# Patient Record
Sex: Male | Born: 1950 | Race: White | Hispanic: No | Marital: Married | Smoking: Former smoker
Health system: Southern US, Community
[De-identification: ages and names within clinical notes are randomized; demographics above are authoritative.]

## PROBLEM LIST (undated history)

## (undated) DIAGNOSIS — J302 Other seasonal allergic rhinitis: Secondary | ICD-10-CM

## (undated) DIAGNOSIS — I8390 Asymptomatic varicose veins of unspecified lower extremity: Secondary | ICD-10-CM

## (undated) DIAGNOSIS — M1990 Unspecified osteoarthritis, unspecified site: Secondary | ICD-10-CM

## (undated) DIAGNOSIS — K219 Gastro-esophageal reflux disease without esophagitis: Secondary | ICD-10-CM

## (undated) HISTORY — PX: APPENDECTOMY: SHX54

## (undated) HISTORY — PX: VARICOSE VEIN SURGERY: SHX832

## (undated) HISTORY — PX: CHOLECYSTECTOMY: SHX55

---

## 2004-10-22 ENCOUNTER — Inpatient Hospital Stay (HOSPITAL_COMMUNITY): Admission: EM | Admit: 2004-10-22 | Discharge: 2004-10-24 | Payer: Self-pay | Admitting: Emergency Medicine

## 2005-01-26 ENCOUNTER — Encounter: Admission: RE | Admit: 2005-01-26 | Discharge: 2005-01-26 | Payer: Self-pay | Admitting: Specialist

## 2008-01-10 ENCOUNTER — Ambulatory Visit (HOSPITAL_COMMUNITY): Admission: RE | Admit: 2008-01-10 | Discharge: 2008-01-10 | Payer: Self-pay | Admitting: Dermatology

## 2008-02-08 ENCOUNTER — Ambulatory Visit: Payer: Self-pay | Admitting: Vascular Surgery

## 2008-05-11 ENCOUNTER — Ambulatory Visit: Payer: Self-pay | Admitting: Vascular Surgery

## 2008-06-20 ENCOUNTER — Ambulatory Visit: Payer: Self-pay | Admitting: Vascular Surgery

## 2008-07-04 ENCOUNTER — Ambulatory Visit: Payer: Self-pay | Admitting: Vascular Surgery

## 2008-07-11 ENCOUNTER — Ambulatory Visit: Payer: Self-pay | Admitting: Vascular Surgery

## 2008-07-18 ENCOUNTER — Ambulatory Visit: Payer: Self-pay | Admitting: Vascular Surgery

## 2008-07-25 ENCOUNTER — Ambulatory Visit: Payer: Self-pay | Admitting: Vascular Surgery

## 2008-09-07 ENCOUNTER — Ambulatory Visit: Payer: Self-pay | Admitting: Vascular Surgery

## 2009-03-20 ENCOUNTER — Ambulatory Visit: Payer: Self-pay | Admitting: Vascular Surgery

## 2009-12-06 ENCOUNTER — Encounter: Admission: RE | Admit: 2009-12-06 | Discharge: 2009-12-06 | Payer: Self-pay | Admitting: Family Medicine

## 2010-11-19 ENCOUNTER — Emergency Department (INDEPENDENT_AMBULATORY_CARE_PROVIDER_SITE_OTHER): Payer: BC Managed Care – PPO

## 2010-11-19 ENCOUNTER — Emergency Department (HOSPITAL_BASED_OUTPATIENT_CLINIC_OR_DEPARTMENT_OTHER)
Admission: EM | Admit: 2010-11-19 | Discharge: 2010-11-19 | Disposition: A | Discharge status: Home / Self Care | Payer: BC Managed Care – PPO | Attending: Emergency Medicine | Admitting: Emergency Medicine

## 2010-11-19 DIAGNOSIS — M7730 Calcaneal spur, unspecified foot: Secondary | ICD-10-CM

## 2010-11-19 DIAGNOSIS — M722 Plantar fascial fibromatosis: Secondary | ICD-10-CM | POA: Insufficient documentation

## 2010-11-19 DIAGNOSIS — M79609 Pain in unspecified limb: Secondary | ICD-10-CM

## 2010-11-27 ENCOUNTER — Encounter: Payer: Self-pay | Admitting: Family Medicine

## 2010-11-27 ENCOUNTER — Ambulatory Visit (INDEPENDENT_AMBULATORY_CARE_PROVIDER_SITE_OTHER): Payer: BC Managed Care – PPO | Admitting: Family Medicine

## 2010-11-27 DIAGNOSIS — M79609 Pain in unspecified limb: Secondary | ICD-10-CM

## 2010-11-27 DIAGNOSIS — M722 Plantar fascial fibromatosis: Secondary | ICD-10-CM | POA: Insufficient documentation

## 2010-11-27 DIAGNOSIS — M216X9 Other acquired deformities of unspecified foot: Secondary | ICD-10-CM | POA: Insufficient documentation

## 2010-12-02 NOTE — Assessment & Plan Note (Signed)
Summary: LEFT PLANTAR FASCIITIS/NP/LP  # [phone]   Vital Signs:  Patient profile:   60 year old male Height:      77 inches (195.58 cm) Weight:      244.0 pounds (110.91 kg) BMI:     29.04 Temp:     97.5 degrees F (36.39 degrees C) oral Pulse rate:   67 / minute BP sitting:   130 / 80  (right arm)  Vitals Entered By: Baxter Hire) (November 27, 2010 11:24 AM) CC: Left plantar fascitis Pain Assessment Patient in pain? yes     Location: left foot Intensity: 2 Nutritional Status BMI of 25 - 29 = overweight  Does patient need assistance? Functional Status Self care Ambulation Normal   CC:  Left plantar fascitis.  History of Present Illness: 60 yo M here with L > R plantar foot pain  Patient reports working on concrete floors past several months Started developing left > right plantar foot pain around december. Has worsened since that time No known injury No swelling or bruising. Had x-rays in emergency department read as normal and diagnosed with plantar fasciitis. Bought some 2/3 dr [employer] cushions for his shoes. Wearing shoes with higher arch support has helped. Has not tried any other treatment (exercises, medicines, bracing).  Habits & Providers  Alcohol-Tobacco-Diet     Alcohol drinks/day: 1-2 glasses     Alcohol type: wine     Tobacco Status: quit > 6 months  Problems Prior to Update: None  Medications Prior to Update: 1)  None  Allergies (verified): 1)  ! * Vitamin C 2)  ! 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 TTP throughout plantar  fascia L > R feet.  No TTP achilles or elsewhere about feet/ankles. FROM ankles and toes. negative ant drawers and talar tilts. 2+ dp pulses.   Impression & Recommendations:  Problem # 1:  FOOT PAIN, BILATERAL (ICD-729.5) Assessment New  Actually has symptoms bilateral feet though left is worse - c/w plantar fasciitis.  Arch straps, stretches and step exercise/eccentrics demonstrated.  Icing, tylenol/aleve as needed.  Comforthotics in combination with arch support already in his shoes - felt comfortable with these - expect these to help as well.  Given a couple pairs of heel lifts to try if still not improving over the next 2-3 weeks.  F/u in 6 weeks to reassess.  See instructions for further.  Orders: Sports Insoles [phone]) Foot Orthosis ( Arch Strap/Heel Cup) [phone])  Problem # 2:  PLANTAR FASCIITIS, BILATERAL (ICD-728.71)  See #1 above  Orders: Sports Insoles (L3510) Foot Orthosis ( Arch Strap/Heel Cup) [phone])  Problem # 3:  CAVUS DEFORMITY OF FOOT, ACQUIRED (ICD-736.73) Assessment: New  Orders: Sports Insoles [phone]) Foot Orthosis ( Arch Strap/Heel Cup) [phone])  Patient Instructions: 1)  You have plantar fasciitis. 2)  Take tylenol or aleve as needed for pain  3)  Plantar fascia stretch for 20-30 seconds in morning and do this 3 times 4)  Lowering/raise on a step exercises 3 x 15 once or twice a day - this is very important for long term improvement. 5)  Can add heel  walks, toe walks forward and backward as well 6)  Ice bucket 10-15 minutes at end of day 7)  Avoid flat shoes/barefoot walking as much as possible. 8)  Arch straps under your socks when up and walking around. 9)  Heel lifts help to unload the area as well. 10)  Custom orthotics, physical therapy, steroid injection are all options if not improving. 11)  Follow up with me in 6 weeks for a recheck.   Orders Added: 1)  New Patient Level III [99203] 2)  Sports Insoles [L3510] 3)  Foot Orthosis ( Arch  Strap/Heel Cup) [J4782]

## 2011-01-08 ENCOUNTER — Ambulatory Visit (INDEPENDENT_AMBULATORY_CARE_PROVIDER_SITE_OTHER): Payer: BC Managed Care – PPO | Admitting: Family Medicine

## 2011-01-08 ENCOUNTER — Encounter: Payer: Self-pay | Admitting: Family Medicine

## 2011-01-08 VITALS — BP 121/78 | HR 67 | Temp 97.4°F | Ht 77.0 in | Wt 233.4 lb

## 2011-01-08 DIAGNOSIS — M79609 Pain in unspecified limb: Secondary | ICD-10-CM

## 2011-01-08 NOTE — Assessment & Plan Note (Signed)
continue with exercises, arch straps, comforthotics, icing.  Has website info to buy more inserts and has done so.  Advised if he does not continue to improve and would consider formal PT, injection, or custom orthotics to return for follow-up.  Otherwise f/u prn.

## 2011-01-08 NOTE — Patient Instructions (Signed)
Keep doing the stretches and exercises (on a step) every day. Continue using the inserts and the arch straps too - all of these things are important for this getting better. Continue icing. Aleve as needed. If you do not continue to improve and want to move forward with physical therapy, a cortisone injection, or custom orthotics, come back for a visit and we'll go ahead with one or some of these. Otherwise follow up with me as needed.

## 2011-01-08 NOTE — Progress Notes (Signed)
  Subjective:    Patient ID: Bruce Suarez, male    DOB: July 13, 1951, 60 y.o.   MRN: [id number]  HPI 60 yo M here for 6 week f/u bilateral plantar fasciitis  Initial visit: Patient reported working on concrete floors past several months  Started developing left > right plantar foot pain around december.  Worsened since that time  No known injury  No swelling or bruising.  Had x-rays in emergency department read as normal and diagnosed with plantar fasciitis.  Bought some 2/3 dr [employer] cushions for his shoes.  Wearing shoes with higher arch support has helped.  Had not tried any other treatment (exercises, medicines, bracing).   60/5: Has been doing regular exercises and stretches, using arch straps, ice baths, comforthotics. He is improving but still has some bad days at times. Not requiring aleve. Pleased with his progress.  History reviewed. No pertinent past medical history.  No current outpatient prescriptions on file prior to visit.    No past surgical history on file.  Allergies  Allergen Reactions  . Peanut-Containing Drug Products   . Vitamin C     History   Social History  . Marital Status: Married    Spouse Name: N/A    Number of Children: N/A  . Years of Education: N/A   Occupational History  . Not on file.   Social History Main Topics  . Smoking status: Never Smoker   . Smokeless tobacco: Not on file  . Alcohol Use: Not on file  . Drug Use: Not on file  . Sexually Active: Not on file   Other Topics Concern  . Not on file   Social History Narrative  . No narrative on file    Family History  Problem Relation Age of Onset  . Heart attack Neg Hx   . Diabetes Neg Hx   . Hypertension Neg Hx     BP 121/78  Pulse 67  Temp(Src) 97.4 F (36.3 C) (Oral)  Ht 6\' 5"  (1.956 m)  Wt 233 lb 6.4 oz (105.87 kg)  BMI 27.68 kg/m2  Review of Systems See HPI above    Objective:   Physical Exam Physical Exam  General:  Well-developed,well-nourished,in no acute distress; alert,appropriate and cooperative throughout examination   Msk: Bilateral feet:  Cavus arches R > L  No other gross deformity, swelling, or bruising.  Mild TTP throughout plantar fascia L > R feet. No TTP achilles or elsewhere about feet/ankles.  FROM ankles and toes.  2+ dp pulses.      Assessment & Plan:  60 yo M with  1. Bilateral foot pain 2/2 bilateral plantar fasciitis - continue with exercises, arch straps, comforthotics, icing.  Has website info to buy more inserts and has done so.  Advised if he does not continue to improve and would consider formal PT, injection, or custom orthotics to return for follow-up.  Otherwise f/u prn.

## 2011-02-17 NOTE — Procedures (Signed)
LOWER EXTREMITY VENOUS REFLUX EXAM   INDICATION:  Followup bilateral lower extremity varicose vein with pain.   EXAM:  Using color-flow imaging and pulse Doppler spectral analysis, the  right and left common femoral, superficial femoral, popliteal, posterior  tibial, greater and lesser saphenous veins are evaluated.  There is no  evidence suggesting deep venous insufficiency in the right or left lower  extremities.   The right saphenofemoral junction is mildly incompetent, the left is  competent.  The right and left greater saphenous veins are not competent  with the caliber as described below.   The right and left proximal short saphenous veins demonstrate  competency.   GSV Diameter (used if found to be incompetent only)                                            Right    Left  Proximal Greater Saphenous Vein           0.49 cm  0.81 cm  Proximal-to-mid-thigh                     cm       cm  Mid thigh                                 0.43 cm  0.51 cm  Mid-distal thigh                          cm       cm  Distal thigh                              0.45 cm  0.71 cm  Knee                                      0.41 cm  0.57 cm    IMPRESSION:  1. The right greater saphenous vein reflux is identified with the      caliber ranging from 0.41 cm to 0.61 cm knee to groin.  The left      from 0.49 cm to 0.81 cm.  2. The right and left greater saphenous veins are not aneurysmal.  3. The right and left greater saphenous veins are not tortuous.  4. The deep venous system is competent.  5. The right and left lesser saphenous veins are competent.       ___________________________________________  Larina Earthly, M.D.   AS/MEDQ  D:  05/11/2008  T:  05/11/2008  Job:  [id number]

## 2011-02-17 NOTE — Consult Note (Signed)
NEW PATIENT CONSULTATION   Bruce Suarez, Bruce Suarez  DOB:  [birth year]                                       02/08/2008  CHART#:[id number]   The patient is referred today by Dr. Vikki Ports for evaluation  of bilateral lower extremity venous insufficiency.  The patient is a  healthy 60 year old white male with a longstanding history of leg  swelling, leg itching and venous varicosities.  He struck his pretibial  area on the right in January with a fall and has still had slow healing  with ulcerations.  He reports several components to the discomfort in  his legs.  He does have pain, severe itching in his calves and on to his  feet bilaterally for greater than 10 years.  He does have marked  swelling in his lower extremities as well.  He does have a slow healing  ulceration of his right pretibial area.  He does have bulging  varicosities, more so on his left than on his right leg.   PAST MEDICAL HISTORY:  Otherwise unremarkable.  He has no major medical  difficulties.  He has been placed on several courses of antibiotics for  the pretibial wound.  He has a strong family history of varicose veins  in his mother and his father.   SOCIAL HISTORY:  He is married.  He has no children.  He is a [employer] officer.  He does not smoke, having quit in 1990.  He does have 2-3  glasses of wine per week.   REVIEW OF SYSTEMS:  A 12 point review of systems is totally negative  other than weight loss.   MEDICATION ALLERGIES:  Vitamin C.  He does also have allergy to peanuts.  He does have allergy to Septra causing a rash.   CURRENT MEDICATIONS:  Amoxicillin for the pretibial area.   PHYSICAL EXAMINATION:  A well-developed, well-nourished white male  appearing his stated age of 14.  Blood pressure 143/84, pulse 74,  respirations 18.  His lower extremities reveal marked swelling in both  legs with a great deal of indentation from his sock and it is 9 a.m. on  this exam.  He  does have skin changes bilaterally, more so on the right  than on the left.  He does have 2+ dorsalis pedis pulses bilaterally.  He has marked saphenous veins in his left calf and some smaller  reticular veins over his right thigh.   He underwent screening duplex by me showing reflux in the saphenous vein  with an enlarged saphenous vein bilaterally.  I discussed this in detail  with the patient.  He has not worn compression garments.  I explained  that this may relieve some of his symptoms.  We have fitted him with  thigh high 20-30 mm compression garments today.  He is encouraged to  continue to elevate his legs if possible and he does take Aleve for the  leg pain.  He also reports some night cramping in his left leg and I  explained that this in all likelihood is not related to his venous  pathology.  We will see him again in 3 months with formal duplex at that  time to determine if he is having successful treatment with conservative  therapy.  I did discuss the option of laser ablation and stab  phlebectomy of  his varicosities for symptom relief.  We will discuss  this further at his next visit.   Larina Earthly, M.D.  Electronically Signed   TFE/MEDQ  D:  02/08/2008  T:  02/09/2008  Job:  [id number]   cc:   Vikki Ports, M.D.

## 2011-02-17 NOTE — Assessment & Plan Note (Signed)
OFFICE VISIT   [name], [name]  DOB:  January 16, 1951                                       07/25/2008  CHART#:[id number]   The patient presents today for a 1-week followup of his left leg great  saphenous vein laser ablation and multiple stab phlebectomies in his  calf.  He had had a prior right leg ablation on 07/04/2008.  He has done  quite well with his treatment as well.  He has the usual erythema to his  thigh at the ablation site and has minimal discomfort at his phlebectomy  sites.  He underwent venous duplex today, and this reveals closure at  his left greater saphenous vein from the proximal calf through the  saphenofemoral junction.  He does not have any evidence of deep vein  thrombosis.  I am pleased with his initial result, as is the patient.  He will continue his usual activities and we will see him again in 6  weeks for final followup.   Larina Earthly, M.D.  Electronically Signed   TFE/MEDQ  D:  07/25/2008  T:  07/26/2008  Job:  [id number]

## 2011-02-17 NOTE — Assessment & Plan Note (Signed)
OFFICE VISIT   Bruce Suarez, Bruce Suarez  DOB:  18-Sep-1951                                       05/11/2008  CHART#:[id number]   The patient presents today for continued follow-up of his lower  extremity venous hypertension.  He has been wearing graduated  compression stockings since our initial visit with him in May 2009.  He  reports that this has not improved his pain and swelling and he does  continue to have left pain and swelling with prolonged sitting.  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  He also reports severe itching and skin breakdown  over his calves bilaterally at the area of hyperpigmentation.  He has  had to stop running secondary to leg pain and swelling, which he prior  did as far as exercise, and reports difficulty sleeping secondary to leg  pain and swelling.  He has worn graduated compression garments 20-30  mmHg for 3 months with no relief.  He does elevate his legs as much as  possible and continues to take ibuprofen.  I discussed the options with  the patient, he clearly has failed conservative therapy.  He underwent  formal duplex today in our office and this confirmed what we had seen  with the screening duplex on his initial visit.  He has had gross reflux  throughout his great saphenous vein bilaterally with reflux into the  varices.  I have recommended staged ablation of his great saphenous vein  bilaterally.  He also has bulging varicosities in the left calf and we  recommended stab phlebectomy in the same setting as his left leg laser  ablation.  He understands and wishes to proceed as soon we can get him  on the schedule.   Larina Earthly, M.D.  Electronically Signed   TFE/MEDQ  D:  05/11/2008  T:  05/14/2008  Job:  [id number]

## 2011-02-17 NOTE — Assessment & Plan Note (Signed)
OFFICE VISIT   Bruce Suarez, Bruce Suarez  DOB:  31-Aug-1951                                       03/20/2009  CHART#:[id number]   The patient presents today for continued discussion regarding lower leg  swelling.  He is well known to me from a prior staged bilateral greater  saphenous vein laser ablation in the Fall of 2009.  He had swelling and  aching preprocedurally.  He had resolution of the swelling and  discomfort.  Over the past month he has noted recurrent swelling  bilaterally.  He has some pitting edema from his knees distally.  He  does not have any pain.  He also has several reticular veins over his  right thigh.  He has not been wearing his compression garments, which we  had not recommended following his procedure.  He underwent repeat venous  duplex in our office to rule out any recurrent venous pathology.  This  shows no evidence of bilateral DVT, mild reflux in the right common  femoral vein.  The small saphenous veins are competent bilaterally and  both great saphenous veins were successfully ablated with no recurrent  recanalization.  I discussed this at length with the patient.  I  explained that I do not see any evidence of venous pathology to explain  his leg swelling.  He will see Dr. Para March, with [facility],  in 2 weeks for a scheduled visit and I asked him to discuss the  possibility of diuretic treatment at that time.  He will see [place] again on  an as-needed basis.   Larina Earthly, M.D.  Electronically Signed   TFE/MEDQ  D:  03/20/2009  T:  03/21/2009  Job:  [id number]   cc:   Dwana Curd. Para March, M.D.

## 2011-02-17 NOTE — Assessment & Plan Note (Signed)
OFFICE VISIT   Bruce Suarez, Bruce Suarez  DOB:  1951/05/04                                       07/11/2008  CHART#:[id number]   Patient presents today with one-week followup of laser ablation of his  right greater saphenous vein.  He has the usual amount of erythema at  the ablation site and otherwise is recovering to his normal health.  He  has very mild bruising and mild-to-moderate inflammation.   He underwent repeat venous duplex today, and this revealed complete  closure of his saphenous vein from the mid calf insertion site up to the  saphenofemoral junction.  The vein was without injury.   I am quite pleased with his initial result, as is patient.  I plan to  see him again in one week for staged ablation of his left greater  saphenous vein.   Larina Earthly, M.D.  Electronically Signed   TFE/MEDQ  D:  07/11/2008  T:  07/12/2008  Job:  [id number]

## 2011-02-17 NOTE — Procedures (Signed)
DUPLEX DEEP VENOUS EXAM - LOWER EXTREMITY   INDICATION:  Bilateral lower extremity edema.   HISTORY:  Edema:  Yes.  Trauma/Surgery:  Bilateral greater saphenous vein ablation.  Pain:  No.  PE:  No.  Previous DVT:  No.  Anticoagulants:  No.  Other:   DUPLEX EXAM:                CFV   SFV   PopV  PTV    GSV                R  L  R  L  R  L  R   L  R  L  Thrombosis    o  o  o  o  o  o  o   o  Spontaneous   +  +  +  +  +  +  +   +  Phasic        +  +  +  +  +  +  +   +  Augmentation  +  +  +  +  +  +  +   +  Compressible  +  +  +  +  +  +  +   +  Competent     +  +  +  +  +  +  +   +   Legend:  + - yes  o - no  p - partial  D - decreased   IMPRESSION:  1. No evidence of deep venous thrombosis in bilateral lower      extremities.  2. Mild reflux noted in right common femoral vein.  3. The right and left short saphenous vein demonstrates competency.  4. Bilateral greater saphenous veins appear ablated.      _____________________________  Larina Earthly, M.D.   AC/MEDQ  D:  03/20/2009  T:  03/20/2009  Job:  [id number]

## 2011-02-17 NOTE — Assessment & Plan Note (Signed)
OFFICE VISIT   Bruce Suarez, Bruce Suarez  DOB:  [birth year]                                       09/07/2008  CHART#:[id number]   The patient presents today for continued follow-up of a staged laser  ablation of bilateral great saphenous vein in September and October of  this year.  He is quite pleased with his result.  He reports a marked  drop in his swelling and discomfort and itching around his ankles.  He  does have improvement in the skin changes that he had from his  preoperative venous hypertension.  His stab phlebectomy sites have all  healed completely.  He is quite pleased with his result, as am I.  He  knows to notify us should he develop any new difficulties.  Otherwise,  we will see him again on an as-needed basis.   Larina Earthly, M.D.  Electronically Signed   TFE/MEDQ  D:  09/07/2008  T:  09/10/2008  Job:  [id number]

## 2011-02-17 NOTE — Procedures (Signed)
DUPLEX DEEP VENOUS EXAM - LOWER EXTREMITY   INDICATION:  Follow-up evaluation, post greater saphenous vein ablation.   HISTORY:  Edema:  No.  Trauma/Surgery:  Two weeks post left greater saphenous vein ablation.  Previous right greater saphenous vein ablation.  Pain:  Left leg.  PE:  No.  Previous DVT:  No.  Anticoagulants:  No.  Other:   DUPLEX EXAM:                CFV   SFV   PopV  PTV    GSV                R  L  R  L  R  L  R   L  R  L  Thrombosis    o  o     o     o      o     +  Spontaneous   +  +     +     +      +     0  Phasic        +  +     +     +      +     0  Augmentation  +  +     +     +      +     0  Compressible  +  +     +     +      +     0  Competent     +  +     +     +      +     0   Legend:  + - yes  o - no  p - partial  D - decreased   IMPRESSION:  1. The left greater saphenous vein is thrombosed from the      saphenofemoral junction into the proximal calf.  The remaining      portion of the greater saphenous vein in the calf is competent.  2. No evidence of thrombus in the common femoral vein or circumflex      vein.  3. No evidence of deep venous thrombus or baker's cyst.    _____________________________  Larina Earthly, M.D.   MC/MEDQ  D:  07/25/2008  T:  07/25/2008  Job:  [id number]

## 2011-02-17 NOTE — Procedures (Signed)
DUPLEX DEEP VENOUS EXAM - LOWER EXTREMITY   INDICATION:  One week followup, laser ablation.   HISTORY:  Edema:  No.  Trauma/Surgery:  Right greater saphenous vein laser ablation on  07/04/08.  Pain:  Right mid thigh tenderness.  PE:  No.  Previous DVT:  No.  Anticoagulants:  Other:   DUPLEX EXAM:                CFV   SFV   PopV  PTV    GSV                R  L  R  L  R  L  R   L  R  L  Thrombosis    o  o  o     o     o      +  Spontaneous   +  +  +     +     +      0  Phasic        +  +  +     +     +      0  Augmentation  +  +  +     +     +      0  Compressible  +  +  +     +     +      0  Competent     0           +            +   Legend:  + - yes  o - no  p - partial  D - decreased   IMPRESSION:  1. No evidence of deep venous thrombosis noted in the right lower      extremity.  2. Totally occluded right greater saphenous vein extending from the      distal insertion site to the groin area with no reflux noted at the      saphenofemoral junction level.  3. Reflux is noted in the right common femoral vein.   _____________________________  Larina Earthly, M.D.   CH/MEDQ  D:  07/11/2008  T:  07/11/2008  Job:  [id number]

## 2011-02-20 NOTE — Op Note (Signed)
NAME:  Bruce Suarez NO.:  [id number]   MEDICAL RECORD NO.:  [id number]          PATIENT TYPE:  INP   LOCATION:  0445                         FACILITY:  [facility]   PHYSICIAN:  [name], M.D.DATE OF BIRTH:  May 18, 1951   DATE OF PROCEDURE:  10/23/2004  DATE OF DISCHARGE:                                 OPERATIVE REPORT   PREOPERATIVE DIAGNOSES:  1.  Acute appendicitis.  2.  Gallstones.   POSTOPERATIVE DIAGNOSES:  1.  Acute appendicitis.  2.  Chronic cholecystectomy with stones.   OPERATIVE PROCEDURE:  Laparoscopic cholecystectomy with cholangiogram and  laparoscopic appendectomy.   SURGEON:  Anselm Pancoast. Zachery Dakins, M.D.   ASSISTANT:  Angelia Mould. Derrell Lolling, M.D.   ANESTHESIA:  General anesthesia.   HISTORY:  Bruce Suarez is a 60 year old male from [place] who works with  Verne Spurr who went to [facility] yesterday with about a two-day history of  kind of vague, right-sided abdominal pain and had a normal white count and  was not acutely tender but was sent to [facility] for a CT that  was read as a moderately enlarged appendix, possible early acute  appendicitis.  He was referred here, and I saw him in the emergency room  about 6 p.m. and on physical exam, his tenderness was definitely to the  right of the umbilicus, not rigid or real acute.  Review of the CT that he  had brought with him showed that he definitely had gallstones and the  appendix was about 9 mm in size, a little bit larger than usual, but I was  not impressed, and he had a lot of acute periappendiceal inflammation, etc.  I re-examined him about two hours later as I was going to the OR when I  first saw him, and his pain was not getting worse and I elected to put him  n.p.o., start him on Unasyn so I could review the CT with our radiologist  since there was no one here after the 7 o'clock time.  I had called and  talked with the radiologist at [facility] who said, yes, he  was not sure  that they had noted the stones on the CAT scan, but he definitely did have  gallstones and they thought he had an early acute appendicitis.  This  morning he is continuing to be basically no change in his abdominal  tenderness, although he is kind of vague with deep palpation.  He did have a  temperature of about 99.  White count was 7,600.  I recommended that we  proceed on with a laparoscopic cholecystectomy with cholangiogram and also  probably appendectomy, hoping we could do them through the same port sites.   DESCRIPTION OF PROCEDURE:  The patient was given 3 g of Unasyn  preoperatively.  I did not place a Foley as he voided right before going to  the OR.  Induction of general anesthesia with endotracheal tube, also had PE  stockings and then the abdomen was prepped with Betadine surgical scrub  solution and draped in sterile fashion.  A small incision was made below the  umbilicus, the fascia identified, carefully opened into the peritoneal  cavity with a Tresa Endo and then a pursestring suture of 0 Vicryl placed.  Hasson cannula was introduced and then the camera was inserted.  The patient  did have a kind of a tense gallbladder, but it was not acutely inflamed.  There were some chronic adhesions around it, and these were carefully taken  down.  The upper 10 mm trocar had been placed under direct vision after  anesthetizing the fascia and the two lateral 5 mm ports were placed quite  far lateral as I hoped they could be used for the appendectomy also.  Then,  we switched to the 30-degree scope so that the more proximal portion of the  gallbladder could be visualized because of his obesity and fat distribution.  I then dissected down and identified the cystic duct, also the cystic artery  and a clip was placed along the cystic duct with the junction of the  gallbladder and doubly clipped proximal to the cystic artery, singly  distally.  We then made a small opening and a  _____ catheter was introduced  and held in place with a clip.  X-ray was obtained which showed good prompt  fill of the extrahepatic biliary system, good flow into the duodenum and a  little short cystic duct.  The left and right bile ducts were also  visualized.  Catheter was removed.  The cystic duct was triply clipped and  then divided.  I replaced one of the clips on the cystic artery proximally  and then divided it and then the gallbladder was freed from its bed with  _____.  There was a little bit of bile spillage that was irrigated and  aspirated, I placed the gallbladder in an EndoCatch bag and then brought it  out at the infraumbilicus.  With the camera in the upper 10 mm port and  reinserting the Hasson cannula, we then placed him in a steep Trendelenburg  position rotated to the left and then dissecting down with the small bowel  drifting over to the left, we could see just a portion of the cecum, but you  could see an inflamed appendix which was grasped first with a grasper and  then switched to the million dollar grasper through the right 5 mm port.  We  then switched the camera to the umbilicus so we could dissect out the  mesentery and the mesenteric vessels, there appeared to be three, that were  doubly clipped with the clipper and then these divided and then the  appendix, cecal space was identified and I went across it with the vascular  GIA.  I had switched the camera back up to the subxiphoid port site for this  so that it would reach through the umbilical port.  The appendix was freed.  There was no evidence of any bleeding.  Good closure of the cecal side, and  then the inflamed appendix was placed in the EndoCatch bag and brought out  through the umbilical fascial defect.  I reinserted the port, irrigated both  the subhepatic area where the gallbladder had been removed, no bile, no  bleeding, also irrigation around the cecum, and there was no bleeding. Irrigating  fluid was removed.  The carbon dioxide was released.  The ports  were withdrawn under direct vision.  I now placed another figure-of-eight of  0 Vicryl in the fascia and umbilicus, anesthetized this fascia and then  removed the subxiphoid port under  direct vision.  The patient's subcutaneous  wounds were closed with 4-0 Vicryl, Benzoin and Steri-Strips in the skin.  The patient tolerated the procedure nicely, and I am going to plan on giving  him one additional dose of Unasyn this evening and hopefully he will be  ready for release tomorrow.      WJW/MEDQ  D:  10/23/2004  T:  10/23/2004  Job:  [id number]

## 2011-07-24 ENCOUNTER — Encounter (HOSPITAL_BASED_OUTPATIENT_CLINIC_OR_DEPARTMENT_OTHER): Payer: Self-pay

## 2011-07-24 ENCOUNTER — Emergency Department (HOSPITAL_BASED_OUTPATIENT_CLINIC_OR_DEPARTMENT_OTHER)
Admission: EM | Admit: 2011-07-24 | Discharge: 2011-07-24 | Disposition: A | Discharge status: Home / Self Care | Payer: BC Managed Care – PPO | Attending: Emergency Medicine | Admitting: Emergency Medicine

## 2011-07-24 DIAGNOSIS — T360X5A Adverse effect of penicillins, initial encounter: Secondary | ICD-10-CM | POA: Insufficient documentation

## 2011-07-24 DIAGNOSIS — T7840XA Allergy, unspecified, initial encounter: Secondary | ICD-10-CM

## 2011-07-24 DIAGNOSIS — Z79899 Other long term (current) drug therapy: Secondary | ICD-10-CM | POA: Insufficient documentation

## 2011-07-24 DIAGNOSIS — R21 Rash and other nonspecific skin eruption: Secondary | ICD-10-CM | POA: Insufficient documentation

## 2011-07-24 MED ORDER — DEXAMETHASONE SODIUM PHOSPHATE 10 MG/ML IJ SOLN
10.0000 mg | Freq: Once | INTRAMUSCULAR | Status: AC
  Administered 2011-07-24: 10 mg via INTRAMUSCULAR
  Filled 2011-07-24: qty 1

## 2011-07-24 MED ORDER — PREDNISONE 10 MG PO TABS: 20.0000 mg | ORAL_TABLET | Freq: Every day | ORAL | Status: AC

## 2011-07-24 NOTE — ED Notes (Signed)
Scattered hives started today-no resp distress noted-new meds since 10/10 (amoxil and tessalon)

## 2011-07-24 NOTE — ED Provider Notes (Signed)
History     CSN: [id number] Arrival date & time: 07/24/2011  9:10 PM   First MD Initiated Contact with Patient 07/24/11 2249      Chief Complaint  Patient presents with  . Rash    (Consider location/radiation/quality/duration/timing/severity/associated sxs/prior treatment) HPI Comments: PT with one day hx of progressive itchy rash.  Recently started amoxicillin.  Has had similar reaction to sulfa drug in past.  No SOB, no swelling of lips/tongue/face  Patient is a 60 y.o. male presenting with rash. The history is provided by the patient.  Rash  This is a new problem. The current episode started 6 to 12 hours ago. The problem has been gradually worsening. The problem is associated with new medications (amoxicillin/tesselon perles). There has been no fever. The rash is present on the torso (extremities). The patient is experiencing no pain. Associated symptoms include itching. Pertinent negatives include no blisters, no pain and no weeping. He has tried nothing for the symptoms.    History reviewed. No pertinent past medical history.  Past Surgical History  Procedure Date  . Cholecystectomy   . Appendectomy     Family History  Problem Relation Age of Onset  . Heart attack Neg Hx   . Diabetes Neg Hx   . Hypertension Neg Hx     History  Substance Use Topics  . Smoking status: Never Smoker   . Smokeless tobacco: Not on file  . Alcohol Use: Yes      Review of Systems  Constitutional: Negative for fever, chills, diaphoresis and fatigue.  HENT: Negative for congestion, rhinorrhea and sneezing.   Eyes: Negative.   Respiratory: Negative for cough, chest tightness and shortness of breath.   Cardiovascular: Negative for chest pain and leg swelling.  Gastrointestinal: Negative for nausea, vomiting, abdominal pain, diarrhea and blood in stool.  Genitourinary: Negative for frequency, hematuria, flank pain and difficulty urinating.  Musculoskeletal: Negative for back pain and  arthralgias.  Skin: Positive for itching and rash.  Neurological: Negative for dizziness, speech difficulty, weakness, numbness and headaches.    Allergies  Peanut-containing drug products; Sulfa drugs cross reactors; and Vitamin c  Home Medications   Current Outpatient Rx  Name Route Sig Dispense Refill  . AMOXICILLIN 875 MG PO TABS Oral Take 875 mg by mouth 2 (two) times daily.     Marland Kitchen BENZONATATE 200 MG PO CAPS Oral Take 200 mg by mouth 3 (three) times daily as needed. For cough    . CETIRIZINE HCL 10 MG PO TABS Oral Take 10 mg by mouth daily.      Marland Kitchen GLUCOSAMINE-CHONDROITIN 500-400 MG PO TABS Oral Take 1 tablet by mouth daily.      Marland Kitchen ONE-DAILY MULTI VITAMINS PO TABS Oral Take 1 tablet by mouth daily.      Marland Kitchen OMEPRAZOLE MAGNESIUM 20 MG PO TBEC Oral Take 20 mg by mouth daily.      Marland Kitchen PREDNISONE 10 MG PO TABS Oral Take 2 tablets (20 mg total) by mouth daily. 10 tablet 0    BP 155/84  Pulse 85  Temp(Src) 98.2 F (36.8 C) (Oral)  Resp 18  Ht 6\' 5"  (1.956 m)  Wt 230 lb (104.327 kg)  BMI 27.27 kg/m2  SpO2 98%  Physical Exam  Constitutional: He is oriented to person, place, and time. He appears well-developed and well-nourished.  HENT:  Head: Normocephalic and atraumatic.  Mouth/Throat: Oropharynx is clear and moist.  Eyes: Pupils are equal, round, and reactive to light.  Neck: Normal range  of motion. Neck supple. No tracheal deviation present.  Cardiovascular: Normal rate, regular rhythm and normal heart sounds.   Pulmonary/Chest: Effort normal and breath sounds normal. No stridor. No respiratory distress. He has no wheezes. He has no rales. He exhibits no tenderness.  Abdominal: Soft. Bowel sounds are normal. There is no tenderness. There is no rebound and no guarding.  Musculoskeletal: Normal range of motion. He exhibits no edema.  Lymphadenopathy:    He has no cervical adenopathy.  Neurological: He is alert and oriented to person, place, and time.  Skin: Skin is warm and dry.  Rash noted. No petechiae and no purpura noted. Rash is urticarial. Rash is not pustular and not vesicular.       Blanching/uritcarial type rash to trunk/extremities  Psychiatric: He has a normal mood and affect.    ED Course  Procedures (including critical care time)  Labs Reviewed - No data to display No results found.   1. Allergic reaction       MDM  Likely medication induced urticarial rash.  Will start steroids/atarax, f/u with PMD.  Advised to stop meds.        Rolan Bucco, MD 07/24/11 [phone]

## 2014-01-19 ENCOUNTER — Emergency Department (HOSPITAL_BASED_OUTPATIENT_CLINIC_OR_DEPARTMENT_OTHER)
Admission: EM | Admit: 2014-01-19 | Discharge: 2014-01-19 | Disposition: A | Discharge status: Home / Self Care | Payer: BC Managed Care – PPO | Attending: Emergency Medicine | Admitting: Emergency Medicine

## 2014-01-19 ENCOUNTER — Emergency Department (HOSPITAL_BASED_OUTPATIENT_CLINIC_OR_DEPARTMENT_OTHER): Payer: BC Managed Care – PPO

## 2014-01-19 ENCOUNTER — Encounter (HOSPITAL_BASED_OUTPATIENT_CLINIC_OR_DEPARTMENT_OTHER): Payer: Self-pay | Admitting: Emergency Medicine

## 2014-01-19 DIAGNOSIS — Z87891 Personal history of nicotine dependence: Secondary | ICD-10-CM | POA: Insufficient documentation

## 2014-01-19 DIAGNOSIS — Y9289 Other specified places as the place of occurrence of the external cause: Secondary | ICD-10-CM | POA: Insufficient documentation

## 2014-01-19 DIAGNOSIS — Y939 Activity, unspecified: Secondary | ICD-10-CM | POA: Insufficient documentation

## 2014-01-19 DIAGNOSIS — K219 Gastro-esophageal reflux disease without esophagitis: Secondary | ICD-10-CM | POA: Insufficient documentation

## 2014-01-19 DIAGNOSIS — S46909A Unspecified injury of unspecified muscle, fascia and tendon at shoulder and upper arm level, unspecified arm, initial encounter: Secondary | ICD-10-CM | POA: Insufficient documentation

## 2014-01-19 DIAGNOSIS — Z79899 Other long term (current) drug therapy: Secondary | ICD-10-CM | POA: Insufficient documentation

## 2014-01-19 DIAGNOSIS — Z880 Allergy status to penicillin: Secondary | ICD-10-CM | POA: Insufficient documentation

## 2014-01-19 DIAGNOSIS — M129 Arthropathy, unspecified: Secondary | ICD-10-CM | POA: Insufficient documentation

## 2014-01-19 DIAGNOSIS — S4980XA Other specified injuries of shoulder and upper arm, unspecified arm, initial encounter: Secondary | ICD-10-CM | POA: Insufficient documentation

## 2014-01-19 DIAGNOSIS — M79603 Pain in arm, unspecified: Secondary | ICD-10-CM

## 2014-01-19 DIAGNOSIS — R296 Repeated falls: Secondary | ICD-10-CM | POA: Insufficient documentation

## 2014-01-19 HISTORY — DX: Unspecified osteoarthritis, unspecified site: M19.90

## 2014-01-19 HISTORY — DX: Gastro-esophageal reflux disease without esophagitis: K21.9

## 2014-01-19 HISTORY — DX: Other seasonal allergic rhinitis: J30.2

## 2014-01-19 MED ORDER — HYDROCODONE-ACETAMINOPHEN 5-325 MG PO TABS: 2.0000 | ORAL_TABLET | ORAL | Status: AC | PRN

## 2014-01-19 MED ORDER — IBUPROFEN 800 MG PO TABS: 800.0000 mg | ORAL_TABLET | Freq: Three times a day (TID) | ORAL | Status: AC

## 2014-01-19 NOTE — ED Notes (Signed)
Pt fell on ice during the winter hurting his right arm.  Sts he had a lot of pain and bruising but did not seek medical attention.  Last week he was leaning on the right arm and felt sudden pain that has not gone away.  No swelling, discoloration or deformity.

## 2014-01-19 NOTE — ED Provider Notes (Signed)
CSN: [id number]     Arrival date & time 01/19/14  1712 History   First MD Initiated Contact with Patient 01/19/14 1803     Chief Complaint  Patient presents with  . Arm Pain     (Consider location/radiation/quality/duration/timing/severity/associated sxs/prior Treatment) Patient is a 63 y.o. male presenting with arm injury. The history is provided by the patient. No language interpreter was used.  Arm Injury Location:  Arm Time since incident:  1 week Injury: no   Arm location:  R arm Pain details:    Quality:  Aching   Radiates to:  R arm   Severity:  No pain   Onset quality:  Gradual   Timing:  Constant   Progression:  Worsening Chronicity:  New Relieved by:  Nothing Worsened by:  Nothing tried Ineffective treatments:  None tried Associated symptoms: swelling   Pt reports he injured his arm in the winter.    Past Medical History  Diagnosis Date  . GERD (gastroesophageal reflux disease)   . Seasonal allergies   . Arthritis    Past Surgical History  Procedure Laterality Date  . Cholecystectomy    . Appendectomy     Family History  Problem Relation Age of Onset  . Heart attack Neg Hx   . Diabetes Neg Hx   . Hypertension Neg Hx    History  Substance Use Topics  . Smoking status: Former Games developermoker  . Smokeless tobacco: Never Used  . Alcohol Use: Yes     Comment: 1 glass wine per week or less    Review of Systems  All other systems reviewed and are negative.     Allergies  Amoxicillin; Peanut-containing drug products; Ascorbic acid; and Sulfa drugs cross reactors  Home Medications   Prior to Admission medications   Medication Sig Start Date End Date Taking? Authorizing Provider  benzonatate (TESSALON) 200 MG capsule Take 200 mg by mouth 3 (three) times daily as needed. For cough    Historical Provider, MD  cetirizine (ZYRTEC) 10 MG tablet Take 10 mg by mouth daily.      Historical Provider, MD  glucosamine-chondroitin 500-400 MG tablet Take 1 tablet by  mouth daily.      Historical Provider, MD  Multiple Vitamin (MULTIVITAMIN) tablet Take 1 tablet by mouth daily.      Historical Provider, MD  omeprazole (PRILOSEC OTC) 20 MG tablet Take 20 mg by mouth daily.      Historical Provider, MD   BP 147/75  Pulse 87  Temp(Src) 98.6 F (37 C) (Oral)  Resp 16  Ht 6\' 5"  (1.956 m)  Wt 217 lb (98.431 kg)  BMI 25.73 kg/m2  SpO2 100% Physical Exam  Vitals reviewed. Constitutional: He appears well-developed and well-nourished.  Musculoskeletal: He exhibits tenderness.  Tender mid forearm,  Decreased range of motion,  nv and ns intact  Neurological: He is alert.  Skin: Skin is warm.    ED Course  Procedures (including critical care time) Labs Review Labs Reviewed - No data to display  Imaging Review Dg Humerus Right  01/19/2014   CLINICAL DATA:  Pain in right upper arm.  EXAM: RIGHT HUMERUS - 2+ VIEW  COMPARISON:  None.  FINDINGS: There is no evidence of fracture or other focal bone lesions. Soft tissues are unremarkable.  IMPRESSION: Negative.   Electronically Signed   By: Herbie BaltimoreWalt  Liebkemann M.D.   On: 01/19/2014 17:55     EKG Interpretation None      MDM   Final diagnoses:  Arm pain    Pt placed in a sling Sling See Dr. Janee Mornhompson for evaluation if pain persist    Elson AreasLeslie K Orlanda Lemmerman, [place] JerseyPA-C 01/19/14 1959

## 2014-01-19 NOTE — Discharge Instructions (Signed)
Myalgia, Adult °Myalgia is the medical term for muscle pain. It is a symptom of many things. Nearly everyone at some time in their life has this. The most common cause for muscle pain is overuse or straining and more so when you are not in shape. Injuries and muscle bruises cause myalgias. Muscle pain without a history of injury can also be caused by a virus. It frequently comes along with the flu. Myalgia not caused by muscle strain can be present in a large number of infectious diseases. Some autoimmune diseases like lupus and fibromyalgia can cause muscle pain. Myalgia may be mild, or severe. °SYMPTOMS  °The symptoms of myalgia are simply muscle pain. Most of the time this is short lived and the pain goes away without treatment. °DIAGNOSIS  °Myalgia is diagnosed by your caregiver by taking your history. This means you tell him when the problems began, what they are, and what has been happening. If this has not been a long term problem, your caregiver may want to watch for a while to see what will happen. If it has been long term, they may want to do additional testing. °TREATMENT  °The treatment depends on what the underlying cause of the muscle pain is. Often anti-inflammatory medications will help. °HOME CARE INSTRUCTIONS °· If the pain in your muscles came from overuse, slow down your activities until the problems go away. °· Myalgia from overuse of a muscle can be treated with alternating hot and cold packs on the muscle affected or with cold for the first couple days. If either heat or cold seems to make things worse, stop their use. °· Apply ice to the sore area for 15-20 minutes, 03-04 times per day, while awake for the first 2 days of muscle soreness, or as directed. Put the ice in a plastic bag and place a towel between the bag of ice and your skin. °· Only take over-the-counter or prescription medicines for pain, discomfort, or fever as directed by your caregiver. °· Regular gentle exercise may help if  you are not active. °· Stretching before strenuous exercise can help lower the risk of myalgia. It is normal when beginning an exercise regimen to feel some muscle pain after exercising. Muscles that have not been used frequently will be sore at first. If the pain is extreme, this may mean injury to a muscle. °SEEK MEDICAL CARE IF: °· You have an increase in muscle pain that is not relieved with medication. °· You begin to run a temperature. °· You develop nausea and vomiting. °· You develop a stiff and painful neck. °· You develop a rash. °· You develop muscle pain after a tick bite. °· You have continued muscle pain while working out even after you are in good condition. °SEEK IMMEDIATE MEDICAL CARE IF: °Any of your problems are getting worse and medications are not helping. °MAKE SURE YOU:  °· Understand these instructions. °· Will watch your condition. °· Will get help right away if you are not doing well or get worse. °Document Released: 08/13/2006 Document Revised: 12/14/2011 Document Reviewed: 11/02/2006 °[employer]® Patient Information ©2014 [employer]. ° °

## 2014-01-19 NOTE — ED Provider Notes (Signed)
Medical screening examination/treatment/procedure(s) were performed by non-physician practitioner and as supervising physician I was immediately available for consultation/collaboration.   EKG Interpretation None        William Jasara Corrigan, MD 01/19/14 2036 

## 2014-02-09 ENCOUNTER — Other Ambulatory Visit: Payer: Self-pay | Admitting: Family Medicine

## 2014-02-09 DIAGNOSIS — E041 Nontoxic single thyroid nodule: Secondary | ICD-10-CM

## 2014-02-12 ENCOUNTER — Ambulatory Visit
Admission: RE | Admit: 2014-02-12 | Discharge: 2014-02-12 | Disposition: A | Discharge status: Home / Self Care | Payer: BC Managed Care – PPO | Source: Ambulatory Visit | Attending: Family Medicine | Admitting: Family Medicine

## 2014-02-12 DIAGNOSIS — E041 Nontoxic single thyroid nodule: Secondary | ICD-10-CM

## 2015-02-13 ENCOUNTER — Other Ambulatory Visit: Payer: Self-pay | Admitting: Family Medicine

## 2015-02-13 DIAGNOSIS — E042 Nontoxic multinodular goiter: Secondary | ICD-10-CM

## 2015-02-19 ENCOUNTER — Ambulatory Visit
Admission: RE | Admit: 2015-02-19 | Discharge: 2015-02-19 | Disposition: A | Discharge status: Home / Self Care | Payer: BLUE CROSS/BLUE SHIELD | Source: Ambulatory Visit | Attending: Family Medicine | Admitting: Family Medicine

## 2015-02-19 DIAGNOSIS — E042 Nontoxic multinodular goiter: Secondary | ICD-10-CM

## 2015-09-12 IMAGING — US US SOFT TISSUE HEAD/NECK
1 series · 14 of 25 positions shown · non-contrast
Comparison: Ultrasound of the thyroid of 12/07/2003

CLINICAL DATA: Followup of thyroid nodules

EXAM:
THYROID ULTRASOUND
TECHNIQUE: Ultrasound examination of the thyroid gland and adjacent soft
tissues was performed.

[Series 1: us soft tissue head/neck · 0.10mm/px · 14 of 72 slices shown]
[im 1/72]
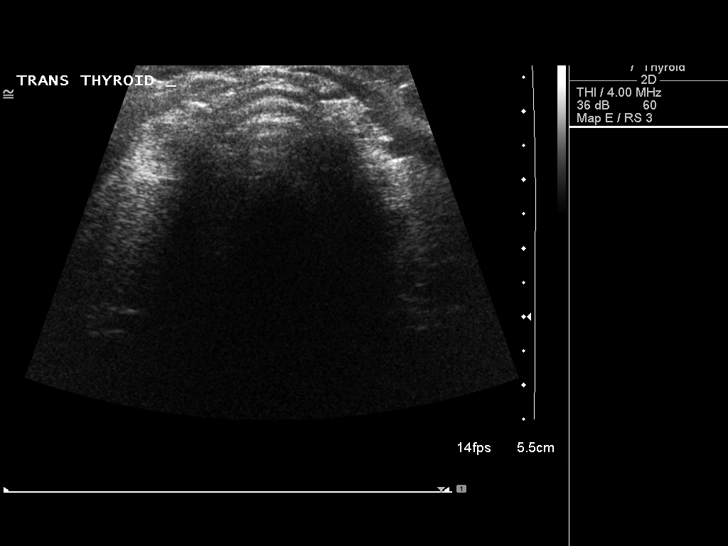
[im 6/72]
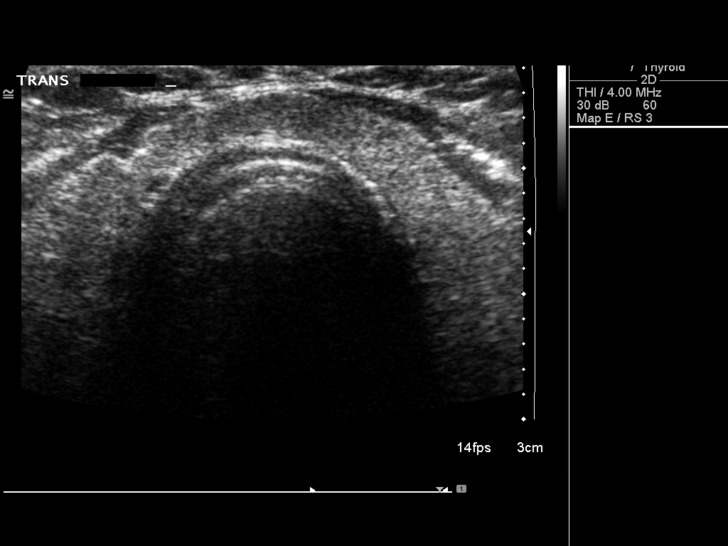
[im 12/72]
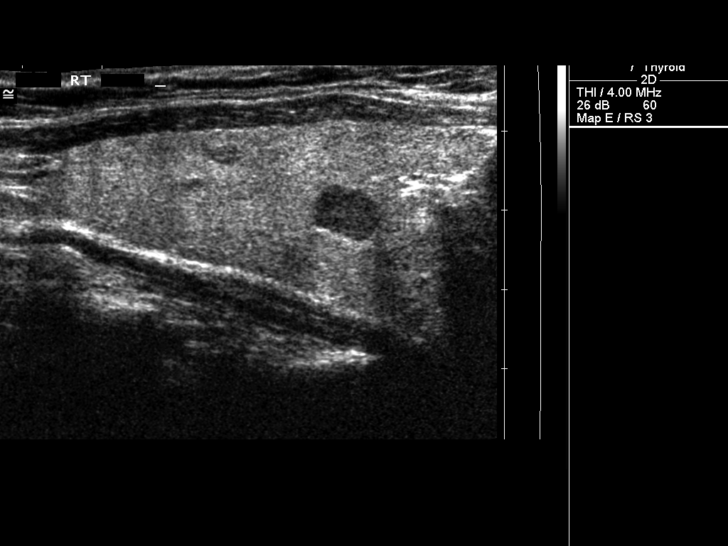
[im 18/72]
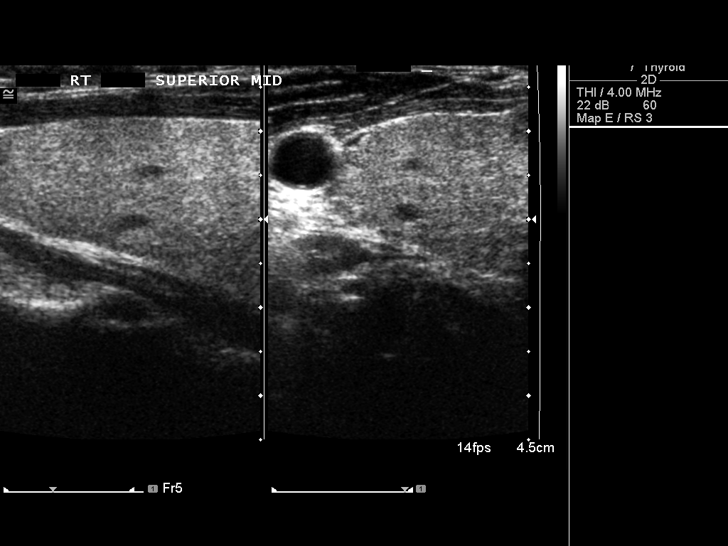
[im 24/72]
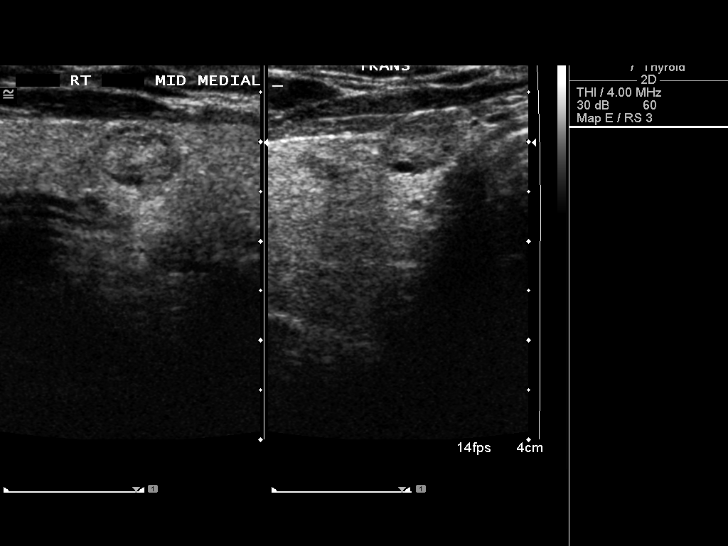
[im 27/72]
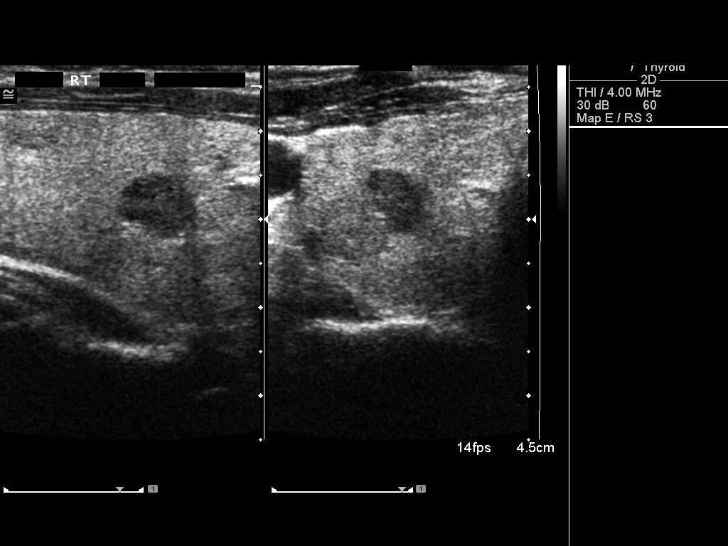
[im 33/72]
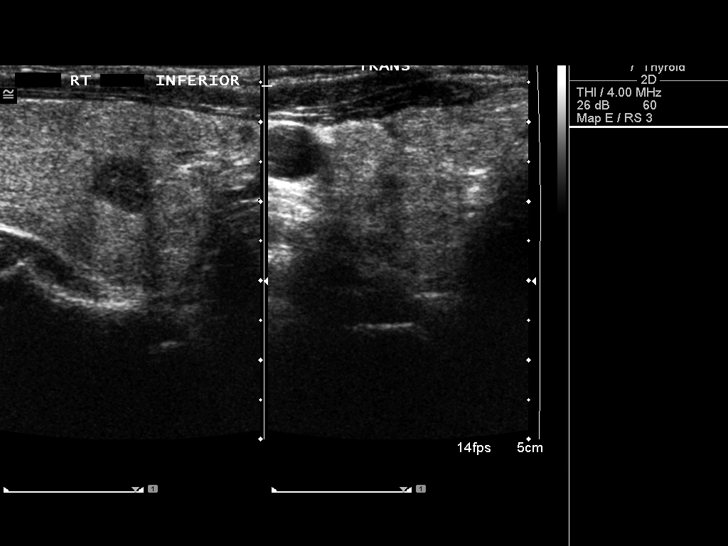
[im 39/72]
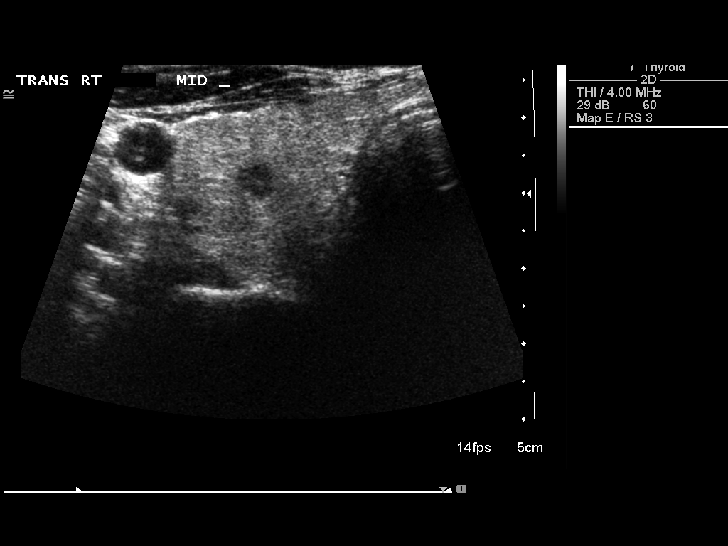
[im 45/72]
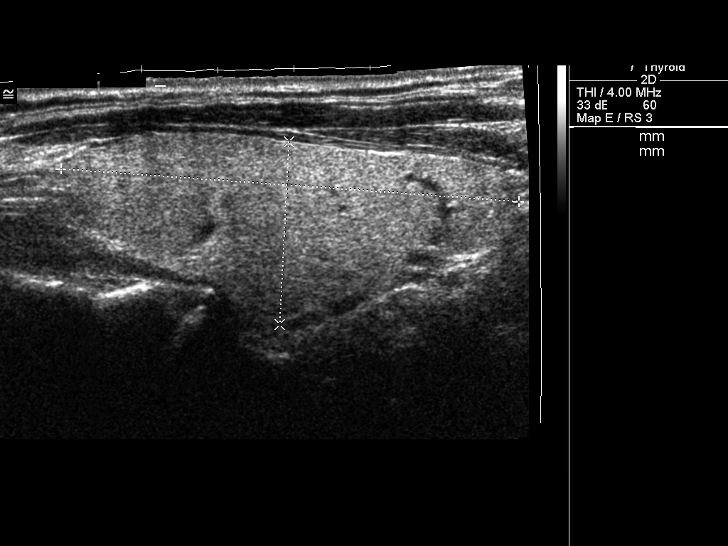
[im 48/72]
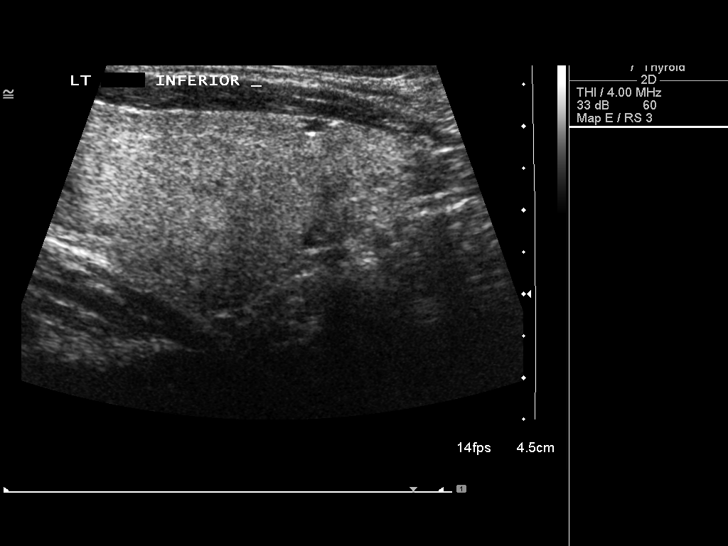
[im 54/72]
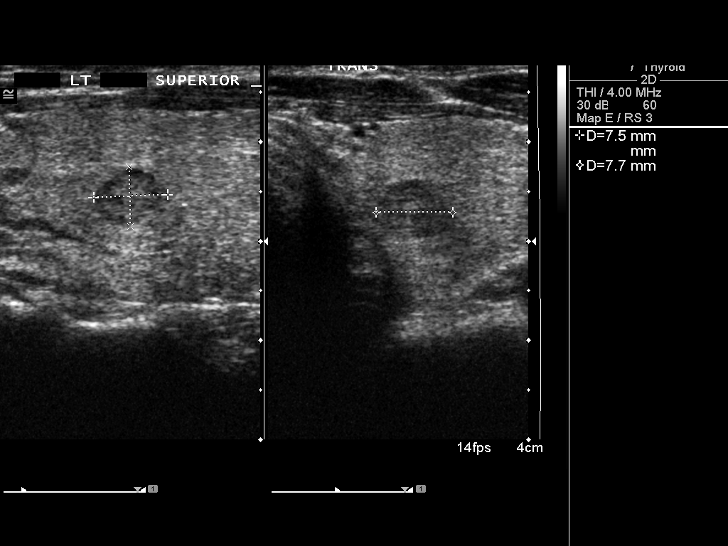
[im 60/72]
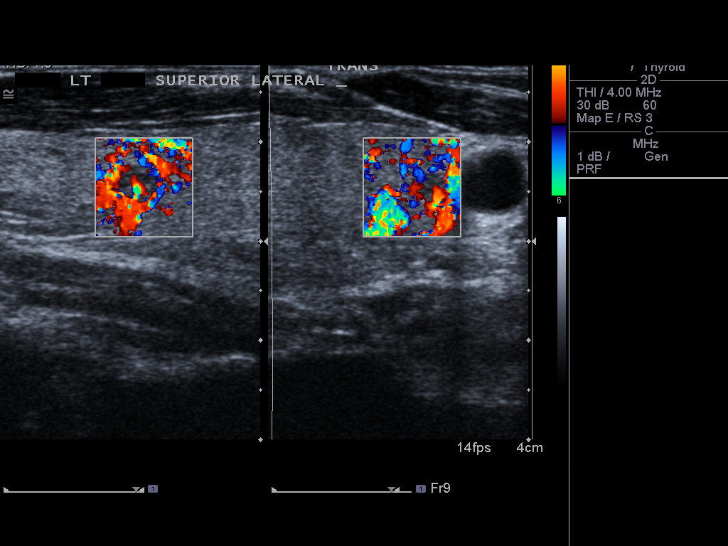
[im 66/72]
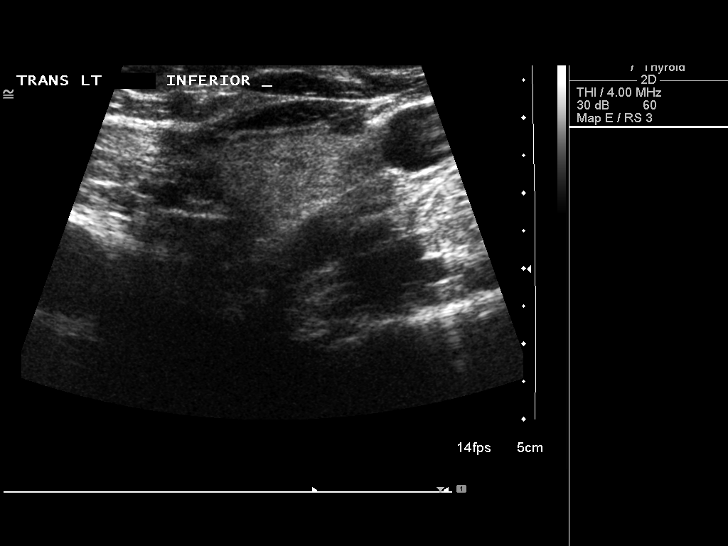
[im 72/72]
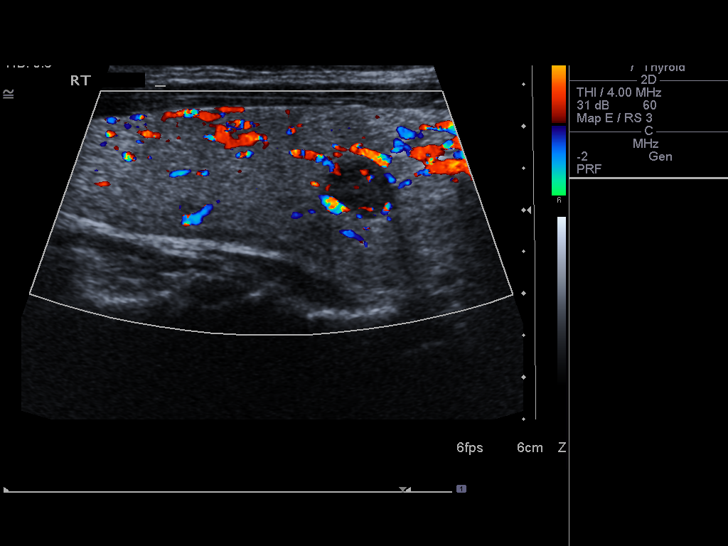

[14 of 25 positions shown; findings below may reference images not displayed]

FINDINGS: Right thyroid lobe

Measurements: 5.7 x 2.5 x 2.6 cm. (Previously 5.1 x 2.3 x 2.3 cm)..
Only small nodules are present throughout the right lobe, none
larger than 11 mm in diameter. These nodules appear relatively
stable compared to the prior ultrasound. The echogenicity of the
thyroid parenchyma is diffusely inhomogeneous.

Left thyroid lobe

Measurements: 6.0 x 2.4 x 2.5 cm. (Previously 6.2 x 2.2 x 2.2 cm)..
Several left thyroid nodules are present, none larger than 8 mm in
diameter and stable over the interval.

Isthmus

Thickness: 4 mm in thickness..  No nodules visualized.

Lymphadenopathy

None visualized.
IMPRESSION: Stable enlarged inhomogeneous thyroid with multiple small nodules as
described above most consistent with multinodular goiter. No
definite enlarging nodule is seen.

## 2016-09-18 IMAGING — US US SOFT TISSUE HEAD/NECK
1 series · 14 of 25 positions shown · non-contrast
Comparison: 02/12/2014

CLINICAL DATA: Thyroid nodules

EXAM:
THYROID ULTRASOUND
TECHNIQUE: Ultrasound examination of the thyroid gland and adjacent soft
tissues was performed.

[Series 1: us soft tissue head/neck · 0.06mm/px · 14 of 75 slices shown]
[im 1/75]
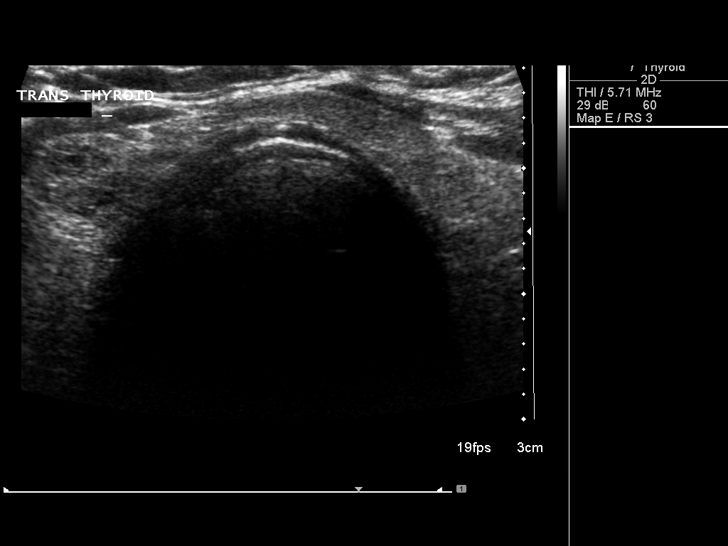
[im 7/75]
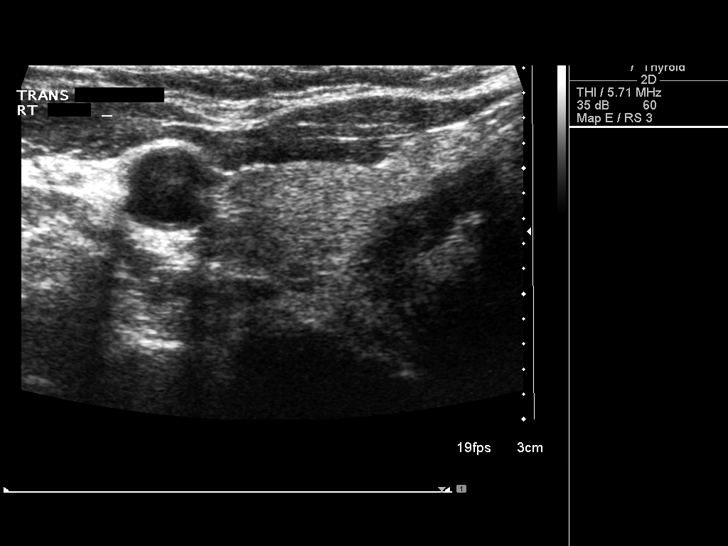
[im 13/75]
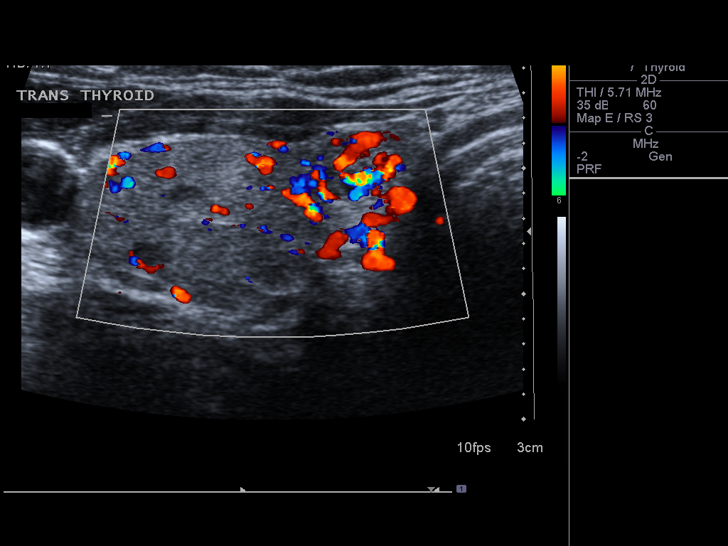
[im 19/75]
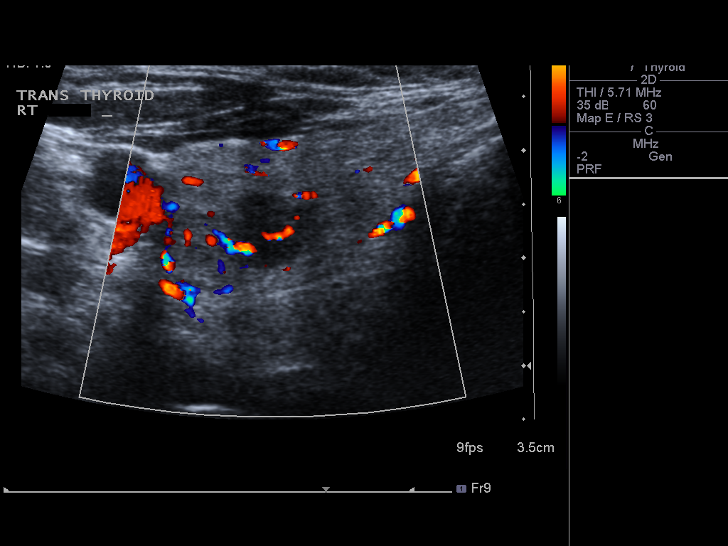
[im 25/75]
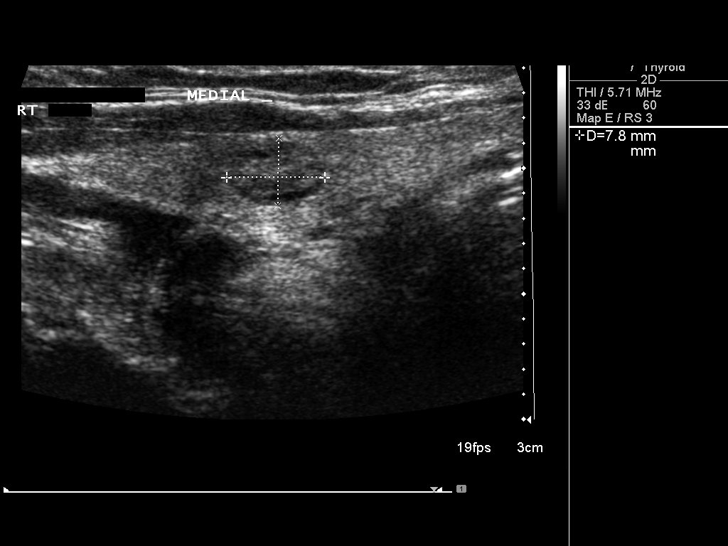
[im 28/75]
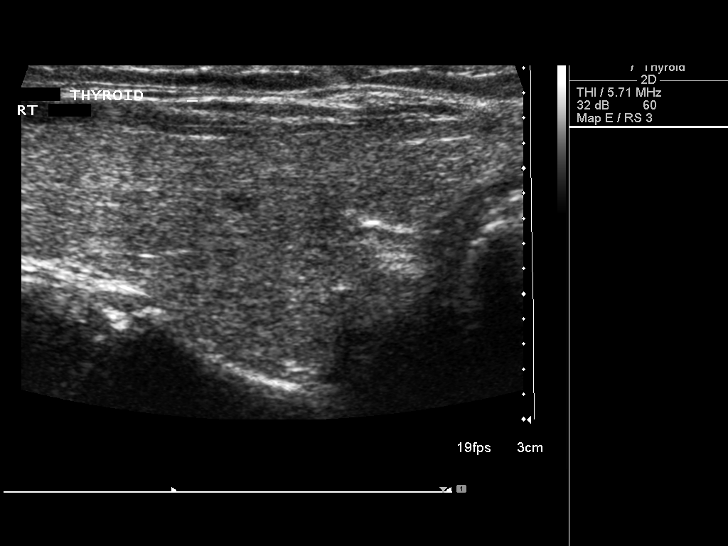
[im 34/75]
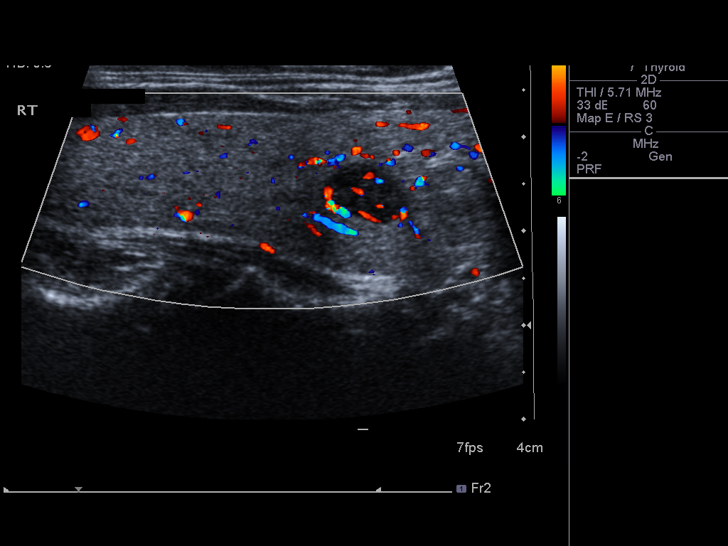
[im 41/75]
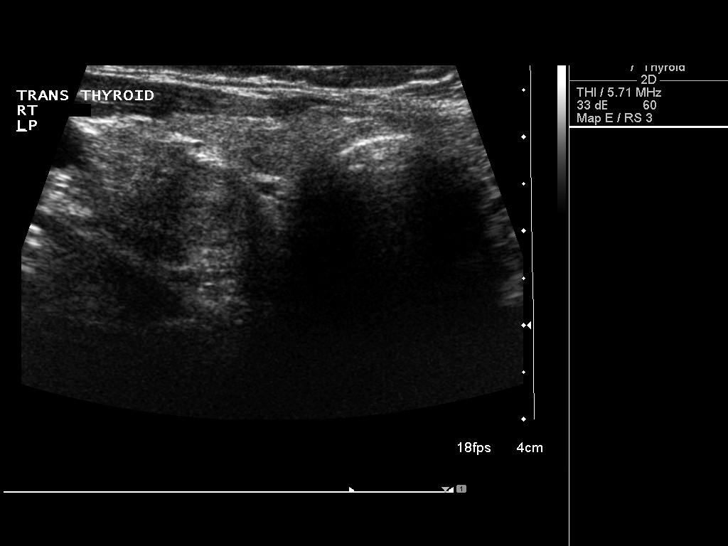
[im 47/75]
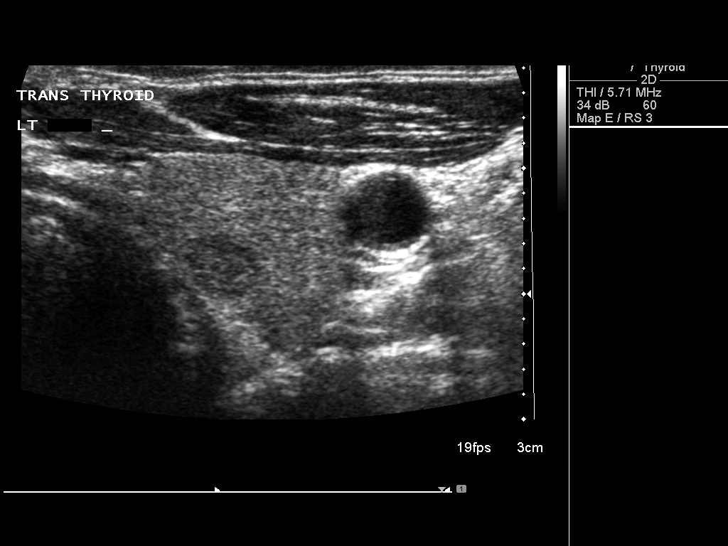
[im 50/75]
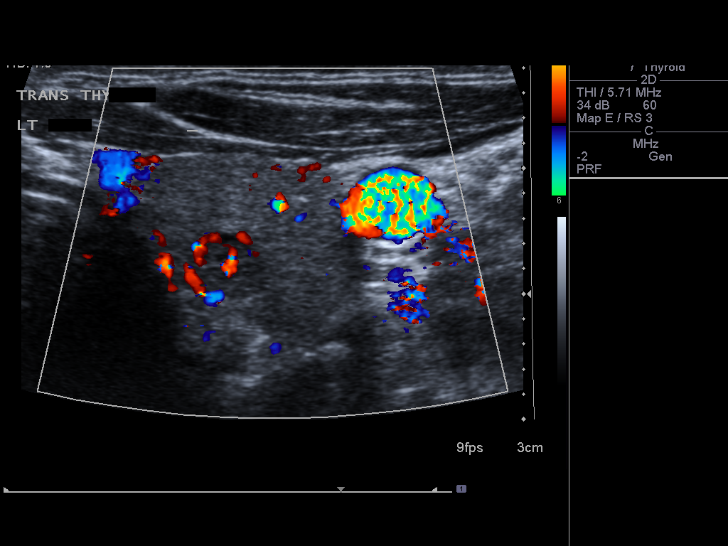
[im 56/75]
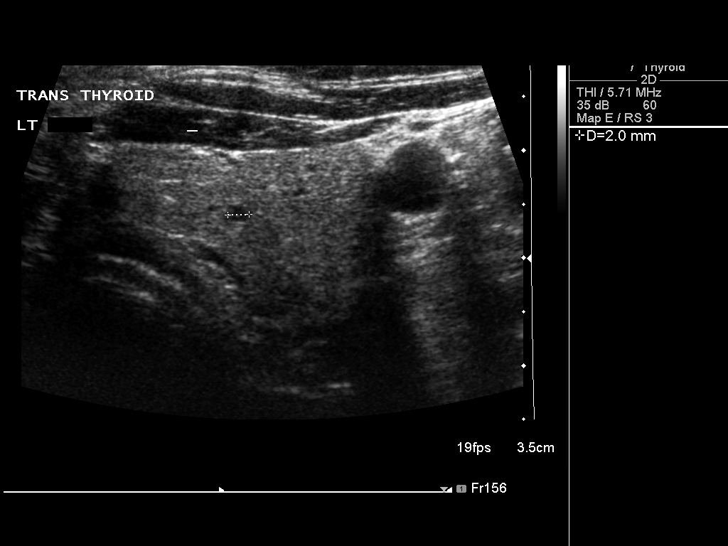
[im 62/75]
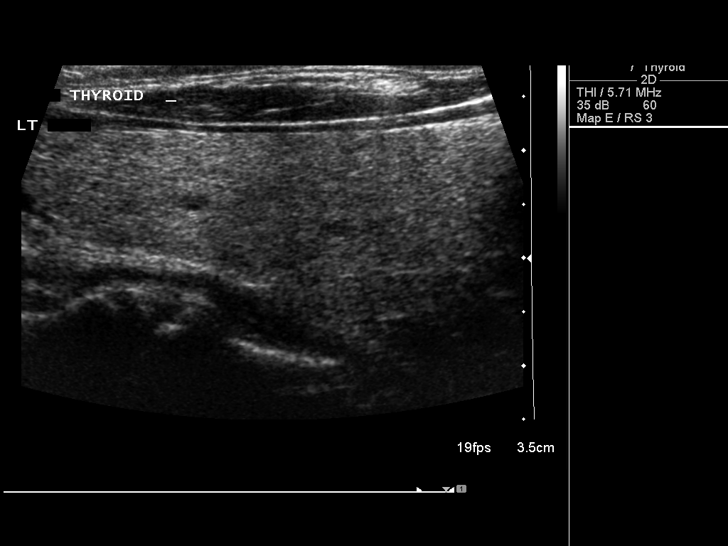
[im 68/75]
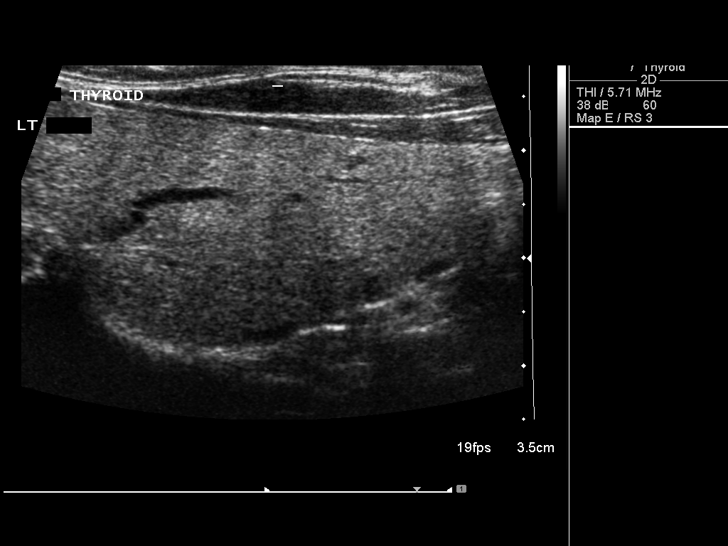
[im 75/75]
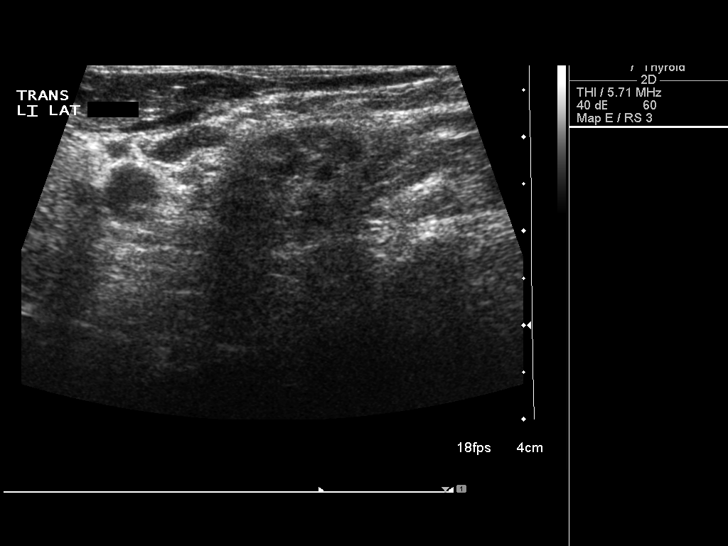

[14 of 25 positions shown; findings below may reference images not displayed]

FINDINGS: Right thyroid lobe

Measurements: 5.6 x 2.0 x 2.3 cm. Numerous scattered small
hyperechoic and hypoechoic right thyroid nodules all measuring 9 mm
or less in size. No significant interval change. No new finding.

Left thyroid lobe

Measurements: 6.0 x 2.3 x 2.5 cm. Similar numerous small hypoechoic
nodules all measuring 8 mm or less in size. No significant interval
change. No new nodule.

Isthmus

Thickness: 4 mm.  No nodules visualized.

Lymphadenopathy

None visualized.
IMPRESSION: Stable bilateral thyroid nodules all measuring 9 mm or less without
significant interval change.

## 2018-03-26 ENCOUNTER — Emergency Department (HOSPITAL_BASED_OUTPATIENT_CLINIC_OR_DEPARTMENT_OTHER): Payer: BLUE CROSS/BLUE SHIELD

## 2018-03-26 ENCOUNTER — Other Ambulatory Visit: Payer: Self-pay

## 2018-03-26 ENCOUNTER — Encounter (HOSPITAL_BASED_OUTPATIENT_CLINIC_OR_DEPARTMENT_OTHER): Payer: Self-pay | Admitting: Emergency Medicine

## 2018-03-26 ENCOUNTER — Emergency Department (HOSPITAL_BASED_OUTPATIENT_CLINIC_OR_DEPARTMENT_OTHER)
Admission: EM | Admit: 2018-03-26 | Discharge: 2018-03-26 | Disposition: A | Discharge status: Home / Self Care | Payer: BLUE CROSS/BLUE SHIELD | Attending: Emergency Medicine | Admitting: Emergency Medicine

## 2018-03-26 DIAGNOSIS — M7121 Synovial cyst of popliteal space [Baker], right knee: Secondary | ICD-10-CM | POA: Diagnosis not present

## 2018-03-26 DIAGNOSIS — Z87891 Personal history of nicotine dependence: Secondary | ICD-10-CM | POA: Diagnosis not present

## 2018-03-26 DIAGNOSIS — M7989 Other specified soft tissue disorders: Secondary | ICD-10-CM

## 2018-03-26 DIAGNOSIS — R2241 Localized swelling, mass and lump, right lower limb: Secondary | ICD-10-CM | POA: Diagnosis present

## 2018-03-26 DIAGNOSIS — M25561 Pain in right knee: Secondary | ICD-10-CM

## 2018-03-26 DIAGNOSIS — Z79899 Other long term (current) drug therapy: Secondary | ICD-10-CM | POA: Diagnosis not present

## 2018-03-26 HISTORY — DX: Asymptomatic varicose veins of unspecified lower extremity: I83.90

## 2018-03-26 NOTE — ED Triage Notes (Addendum)
Patient states that he was recently in [place]. He arrived home from a flight on Saturday  - since he has pain to his left knee and own his left leg. He denies any Chest pain or SOB - reports that he has issues with " my veins"  in the past

## 2018-03-26 NOTE — ED Provider Notes (Signed)
[facility] EMERGENCY DEPARTMENT Provider Note   CSN: [id number] Arrival date & time: 03/26/18  1005     History   Chief Complaint Chief Complaint  Patient presents with  . Leg Swelling    HPI Bruce Suarez is a 67 y.o. male with a PMHx of varicose veins, arthritis, plantar fasciitis, and other conditions listed below, who presents to the ED with complaints of 3 days of right knee pain.  Patient flew home from [place] last Saturday, a week ago, going on a 10-hour plane ride and then a 2-hour plane ride.  He states that he wore compression socks but did not get up as much as he wanted to during the plane rides.  Three days ago he started having right knee pain that he initially thought was just from arthritis, but the pain gradually worsened and he started having swelling in his right calf area so he decided to come in for evaluation.  He describes the pain as 7/10 at worst constant sharp nonradiating right knee pain mostly posteriorly and medially, worse with laying with his knee straight out and walking especially up stairs, and unrelieved with ibuprofen and compression.  He reports associated swelling into the calf.  He denies any trauma or injury.  He denies any recent surgery or prolonged immobilization aside from his plane ride, denies any personal or family history of DVT/PE.  He denies any erythema, warmth, or bruising to the leg or knee.  He also denies any fevers, chills, CP, SOB, abdominal pain, nausea, vomiting, numbness, tingling, focal weakness, or any other complaints at this time.  The history is provided by the patient and medical records. No language interpreter was used.    Past Medical History:  Diagnosis Date  . Arthritis   . GERD (gastroesophageal reflux disease)   . Seasonal allergies   . Varicose vein of leg     Patient Active Problem List   Diagnosis Date Noted  . PLANTAR FASCIITIS, BILATERAL 11/27/2010  . FOOT PAIN, BILATERAL 11/27/2010  .  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 APPENDECTOMY    . CHOLECYSTECTOMY          Home Medications    Prior to Admission medications   Medication Sig Start Date End Date Taking? Authorizing Provider  benzonatate (TESSALON) 200 MG capsule Take 200 mg by mouth 3 (three) times daily as needed. For cough    [provider]  cetirizine (ZYRTEC) 10 MG tablet Take 10 mg by mouth daily.      [provider]  glucosamine-chondroitin 500-400 MG tablet Take 1 tablet by mouth daily.      [provider]  HYDROcodone-acetaminophen (NORCO/VICODIN) 5-325 MG per tablet Take 2 tablets by mouth every 4 (four) hours as needed. 01/19/14   Elson AreasSofia, Leslie K, PA-C  ibuprofen (ADVIL,MOTRIN) 800 MG tablet Take 1 tablet (800 mg total) by mouth 3 (three) times daily. 01/19/14   Elson AreasSofia, Leslie K, PA-C  Multiple Vitamin (MULTIVITAMIN) tablet Take 1 tablet by mouth daily.      [provider]  omeprazole (PRILOSEC OTC) 20 MG tablet Take 20 mg by mouth daily.      [provider]    Family History Family History  Problem Relation Age of Onset  . Heart attack Neg Hx   . Diabetes Neg Hx   . Hypertension Neg Hx     Social History Social History   Tobacco  Use  . Smoking status: Former Smoker  . Smokeless tobacco: Never Used  Substance Use Topics  . Alcohol use: Yes    Comment: 1 glass wine per week or less  . Drug use: No     Allergies   Amoxicillin; Peanut-containing drug products; Ascorbic acid; and Sulfa drugs cross reactors   Review of Systems Review of Systems  Constitutional: Negative for chills and fever.  Respiratory: Negative for shortness of breath.   Cardiovascular: Positive for leg swelling. Negative for chest pain.  Gastrointestinal: Negative for abdominal pain, nausea and vomiting.  Musculoskeletal: Positive for arthralgias and myalgias.  Skin: Negative for color change.    Allergic/Immunologic: Negative for immunocompromised state.  Neurological: Negative for weakness and numbness.  Psychiatric/Behavioral: Negative for confusion.   All other systems reviewed and are negative for acute change except as noted in the HPI.    Physical Exam Updated Vital Signs Pulse 64   Temp 98 F (36.7 C) (Oral)   Resp 18   Ht 6\' 5"  (1.956 m)   Wt 104.3 kg (230 lb)   SpO2 100%   BMI 27.27 kg/m   Physical Exam  Constitutional: He is oriented to person, place, and time. Vital signs are normal. He appears well-developed and well-nourished.  Non-toxic appearance. No distress.  Afebrile, nontoxic, NAD  HENT:  Head: Normocephalic and atraumatic.  Mouth/Throat: Mucous membranes are normal.  Eyes: Conjunctivae and EOM are normal. Right eye exhibits no discharge. Left eye exhibits no discharge.  Neck: Normal range of motion. Neck supple.  Cardiovascular: Normal rate and intact distal pulses.  Pulmonary/Chest: Effort normal. No respiratory distress.  Abdominal: Normal appearance. He exhibits no distension.  Musculoskeletal: Normal range of motion.       Right knee: He exhibits swelling. He exhibits normal range of motion, no ecchymosis, no deformity, no laceration, no erythema, normal alignment, no LCL laxity, normal patellar mobility and no MCL laxity. Tenderness found. Medial joint line tenderness noted.       Legs: R knee with FROM intact, with mild medial joint line TTP, mild tenderness to proximal posterior calf and popliteal fossa, ?baker's cyst palpable to medial popliteal fossa overlying area of tenderness, some mild swelling in the medial knee and extending into the calf with 1+ pedal edema in the lower leg, no crepitus or deformity, no bruising or erythema, no warmth, no abnormal alignment or patellar mobility, no varus/valgus laxity, neg anterior drawer test.  Strength and sensation grossly intact, distal pulses intact, compartments soft. Trace pedal edema to LLE.    Neurological: He is alert and oriented to person, place, and time. He has normal strength. No sensory deficit.  Skin: Skin is warm, dry and intact. No rash noted.  Psychiatric: He has a normal mood and affect.  Nursing note and vitals reviewed.    ED Treatments / Results  Labs (all labs ordered are listed, but only abnormal results are displayed) Labs Reviewed - No data to display  EKG None  Radiology US Venous Img Lower Unilateral Right  Result Date: 03/26/2018 CLINICAL DATA:  Right leg pain and swelling. EXAM: RIGHT LOWER EXTREMITY VENOUS DOPPLER ULTRASOUND TECHNIQUE: Gray-scale sonography with graded compression, as well as color Doppler and duplex ultrasound were performed to evaluate the lower extremity deep venous systems from the level of the common femoral vein and including the common femoral, femoral, profunda femoral, popliteal and calf veins including the posterior tibial, peroneal and gastrocnemius veins when visible. The superficial great saphenous vein was also interrogated.  Spectral Doppler was utilized to evaluate flow at rest and with distal augmentation maneuvers in the common femoral, femoral and popliteal veins. COMPARISON:  None. FINDINGS: Contralateral Common Femoral Vein: Respiratory phasicity is normal and symmetric with the symptomatic side. No evidence of thrombus. Normal compressibility. Common Femoral Vein: No evidence of thrombus. Normal compressibility, respiratory phasicity and response to augmentation. Saphenofemoral Junction: No evidence of thrombus. Normal compressibility and flow on color Doppler imaging. Profunda Femoral Vein: No evidence of thrombus. Normal compressibility and flow on color Doppler imaging. Femoral Vein: No evidence of thrombus. Normal compressibility, respiratory phasicity and response to augmentation. Popliteal Vein: No evidence of thrombus. Normal compressibility, respiratory phasicity and response to augmentation. Calf Veins: No evidence  of thrombus. Normal compressibility and flow on color Doppler imaging. Superficial Great Saphenous Vein: No evidence of thrombus. Normal compressibility. Venous Reflux:  None. Other Findings:  Knee joint effusion.  Possible Baker cyst. IMPRESSION: 1. No evidence of deep venous thrombosis. 2. Knee joint effusion with possible Baker cyst. Electronically Signed   By: Obie Dredge M.D.   On: 03/26/2018 11:49    Procedures Procedures (including critical care time)  Medications Ordered in ED Medications - No data to display   Initial Impression / Assessment and Plan / ED Course  I have reviewed the triage vital signs and the nursing notes.  Pertinent labs & imaging results that were available during my care of the patient were reviewed by me and considered in my medical decision making (see chart for details).     67 y.o. male here with R knee pain and calf/knee swelling x3 days; just got back from [place] on a long airplane ride. On exam, mild TTP to posterior proximal calf extending into the popliteal fossa area, ?baker's cyst palpable to medial popliteal fossa overlying area of tenderness, 1+ pitting edema, no discoloration, NVI with soft compartments, mild tenderness to medial joint line but FROM intact and no crepitus/deformity. DVT U/S in process. Pt declines wanting anything for pain. Will reassess shortly.   1:23 PM DVT U/S showing no evidence of DVT, demonstrates possible baker's cyst, which is consistent with the exam findings. Advised RICE, pt has home compression sleeve and compression socks so advised use of these, discussed tylenol/motrin for pain relief, and f/up with PCP in 1wk. Discussed case with my attending Dr. Criss Alvine who agrees with plan.  I explained the diagnosis and have given explicit precautions to return to the ER including for any other new or worsening symptoms. The patient understands and accepts the medical plan as it's been dictated and I have answered their  questions. Discharge instructions concerning home care and prescriptions have been given. The patient is STABLE and is discharged to home in good condition.    Final Clinical Impressions(s) / ED Diagnoses   Final diagnoses:  Baker's cyst of knee, right  Acute pain of right knee  Leg swelling    ED Discharge Orders    [address], [place], [place] 03/26/18 1323    Pricilla Loveless, MD 03/26/18 1535

## 2018-03-26 NOTE — Discharge Instructions (Addendum)
Wear your home knee sleeve to provide compression to the knee, to help with pain/swelling. Use compression socks to help with leg swelling. Ice and elevate knee/leg throughout the day, using ice pack for no more than 20 minutes every hour. Alternate between tylenol and ibuprofen as needed for pain relief. Follow up with your regular doctor in 1 week for recheck of symptoms. Return to the ER for changes or worsening symptoms.

## 2019-08-08 IMAGING — US US EXTREM LOW VENOUS*R*
1 series · 13 of 24 positions shown · non-contrast
Comparison: None.

CLINICAL DATA: Right leg pain and swelling.



[Series 1: us extrem low venous*right* · 0.09mm/px · 13 of 46 slices shown]
[im 1/46]
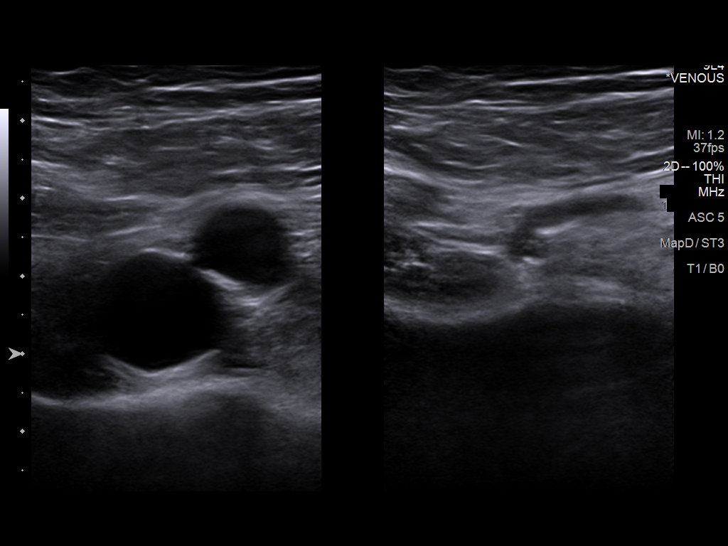
[im 4/46]
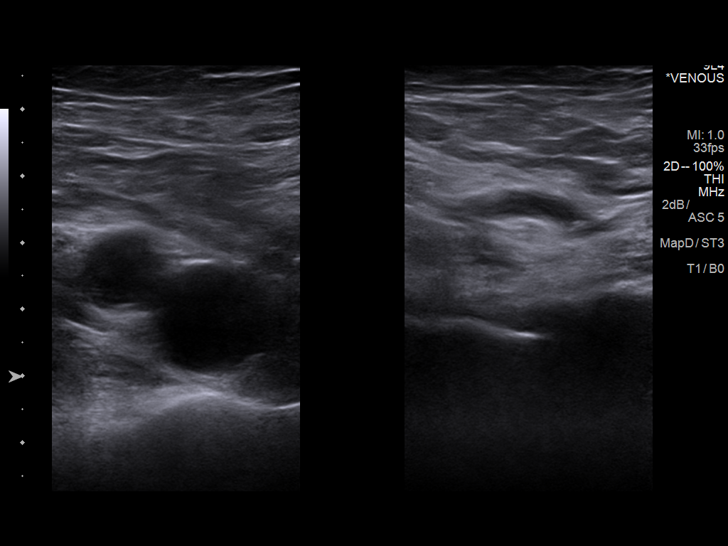
[im 8/46]
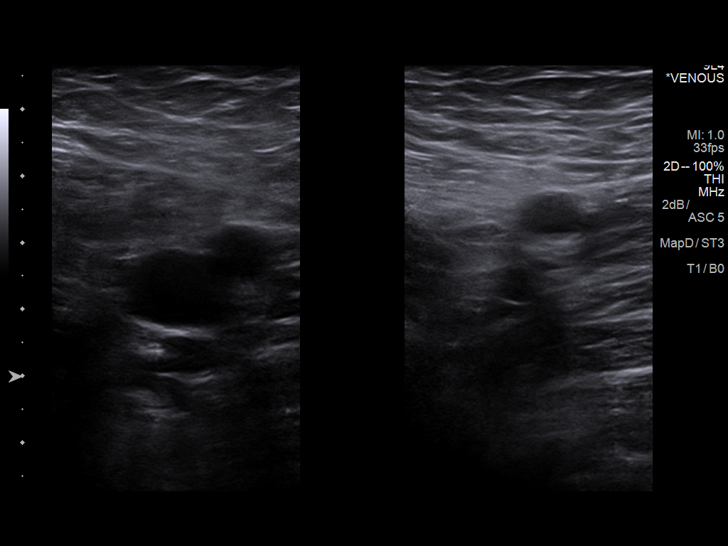
[im 12/46]
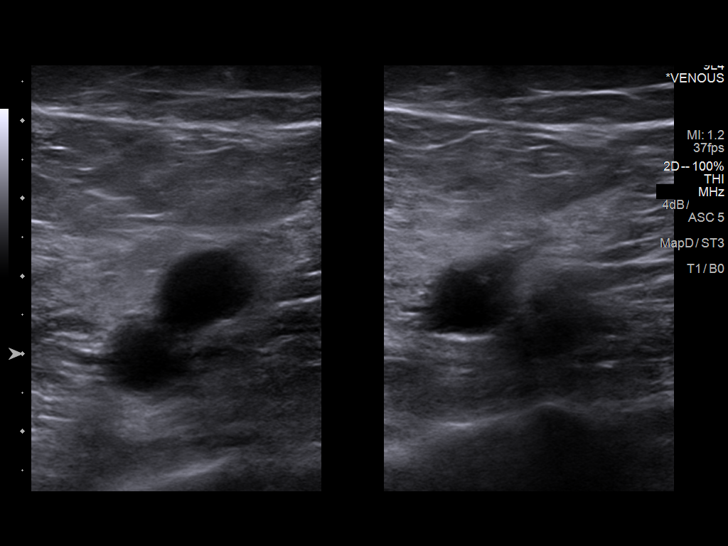
[im 16/46]
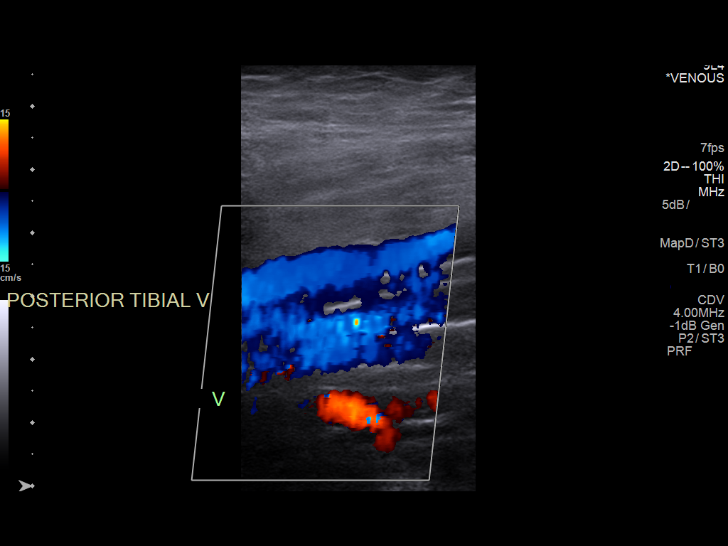
[im 20/46]
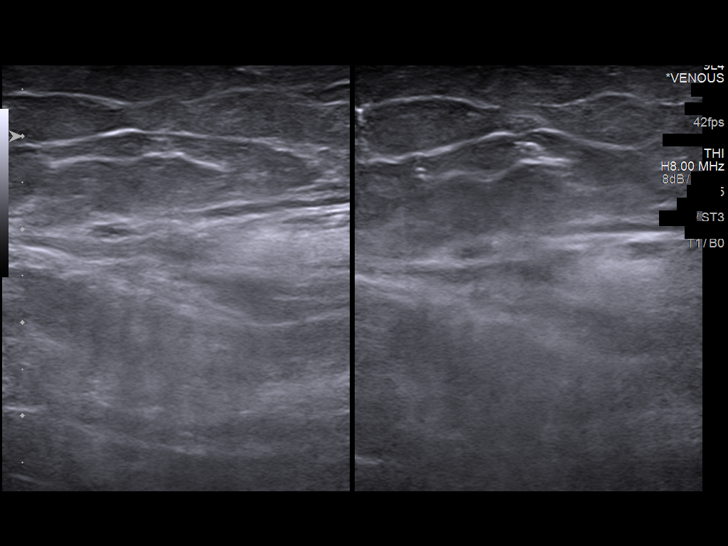
[im 24/46]
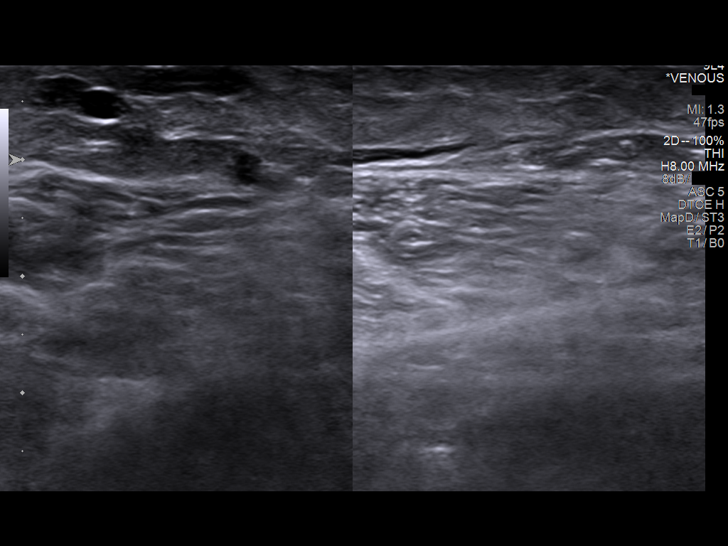
[im 26/46]
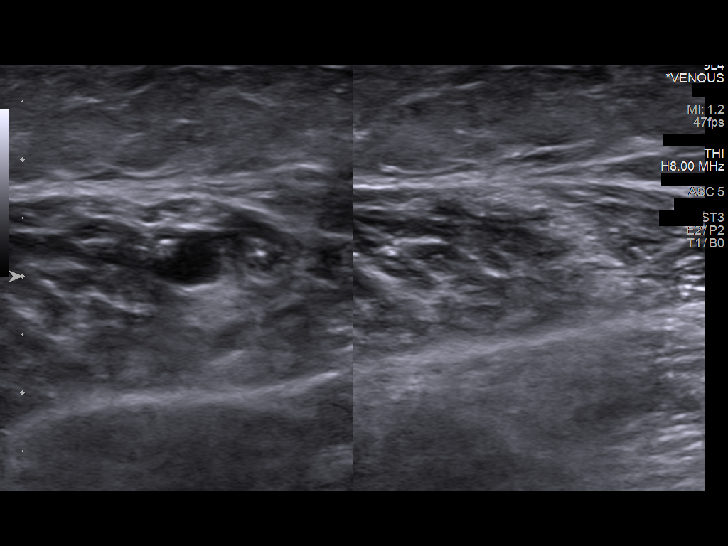
[im 30/46]
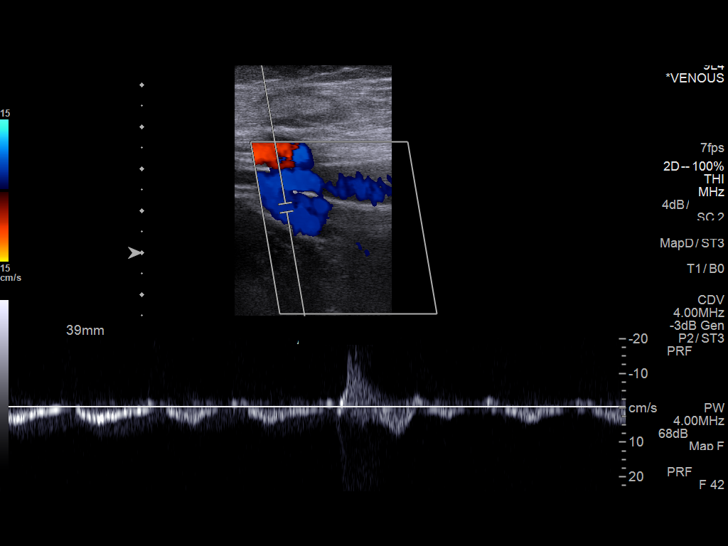
[im 34/46]
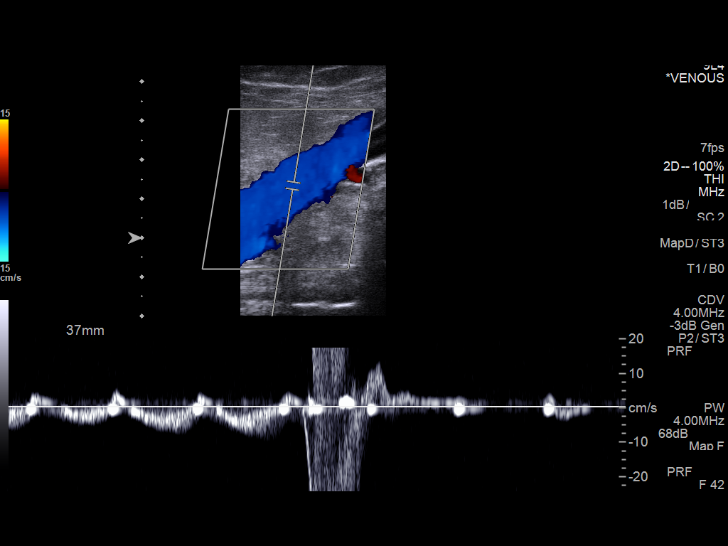
[im 38/46]
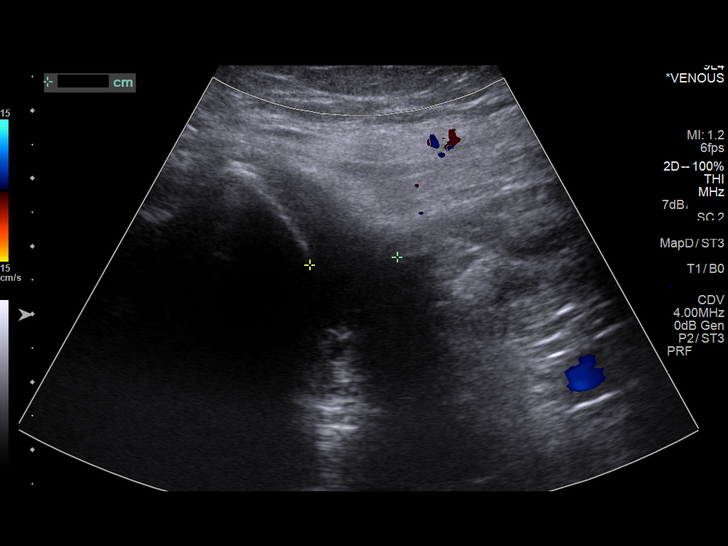
[im 42/46]
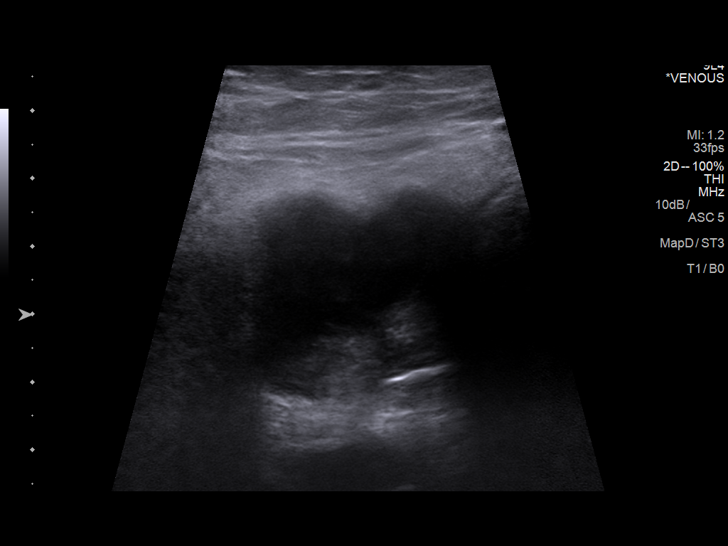
[im 46/46]
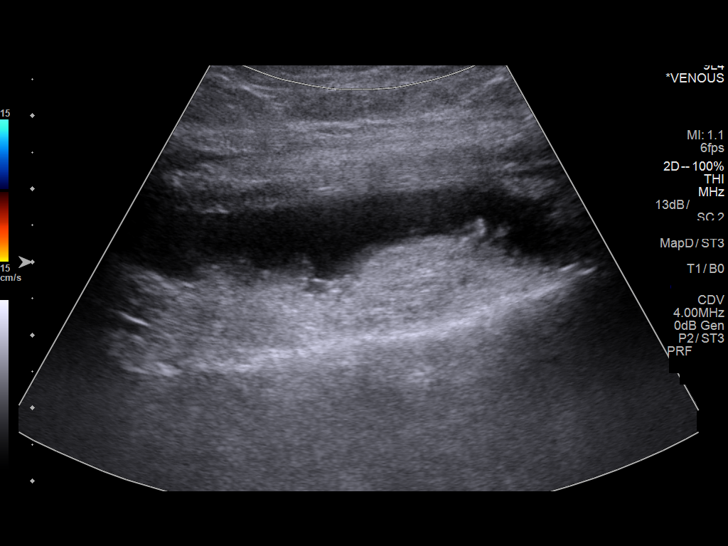

[13 of 24 positions shown; findings below may reference images not displayed]

FINDINGS: Contralateral Common Femoral Vein: Respiratory phasicity is normal
and symmetric with the symptomatic side. No evidence of thrombus.
Normal compressibility.

Common Femoral Vein: No evidence of thrombus. Normal
compressibility, respiratory phasicity and response to augmentation.

Saphenofemoral Junction: No evidence of thrombus. Normal
compressibility and flow on color Doppler imaging.

Profunda Femoral Vein: No evidence of thrombus. Normal
compressibility and flow on color Doppler imaging.

Femoral Vein: No evidence of thrombus. Normal compressibility,
respiratory phasicity and response to augmentation.

Popliteal Vein: No evidence of thrombus. Normal compressibility,
respiratory phasicity and response to augmentation.

Calf Veins: No evidence of thrombus. Normal compressibility and flow
on color Doppler imaging.

Superficial Great Saphenous Vein: No evidence of thrombus. Normal
compressibility.

Venous Reflux:  None.

Other Findings:  Knee joint effusion.  Possible Baker cyst.
IMPRESSION: 1. No evidence of deep venous thrombosis.
2. Knee joint effusion with possible Baker cyst.
